# Patient Record
Sex: Male | Born: 1945 | Race: White | Hispanic: No | Marital: Married | Smoking: Former smoker
Health system: Southern US, Community
[De-identification: ages and names within clinical notes are randomized; demographics above are authoritative.]

## PROBLEM LIST (undated history)

## (undated) DIAGNOSIS — Z95810 Presence of automatic (implantable) cardiac defibrillator: Secondary | ICD-10-CM

## (undated) DIAGNOSIS — I495 Sick sinus syndrome: Secondary | ICD-10-CM

## (undated) DIAGNOSIS — I480 Paroxysmal atrial fibrillation: Secondary | ICD-10-CM

## (undated) DIAGNOSIS — I10 Essential (primary) hypertension: Secondary | ICD-10-CM

## (undated) DIAGNOSIS — C61 Malignant neoplasm of prostate: Secondary | ICD-10-CM

## (undated) DIAGNOSIS — J449 Chronic obstructive pulmonary disease, unspecified: Secondary | ICD-10-CM

## (undated) DIAGNOSIS — I639 Cerebral infarction, unspecified: Secondary | ICD-10-CM

## (undated) DIAGNOSIS — I2510 Atherosclerotic heart disease of native coronary artery without angina pectoris: Secondary | ICD-10-CM

## (undated) DIAGNOSIS — N189 Chronic kidney disease, unspecified: Secondary | ICD-10-CM

## (undated) DIAGNOSIS — E119 Type 2 diabetes mellitus without complications: Secondary | ICD-10-CM

## (undated) DIAGNOSIS — I5022 Chronic systolic (congestive) heart failure: Secondary | ICD-10-CM

## (undated) DIAGNOSIS — E785 Hyperlipidemia, unspecified: Secondary | ICD-10-CM

## (undated) DIAGNOSIS — D649 Anemia, unspecified: Secondary | ICD-10-CM

## (undated) DIAGNOSIS — I872 Venous insufficiency (chronic) (peripheral): Secondary | ICD-10-CM

## (undated) DIAGNOSIS — G4733 Obstructive sleep apnea (adult) (pediatric): Secondary | ICD-10-CM

## (undated) DIAGNOSIS — I739 Peripheral vascular disease, unspecified: Secondary | ICD-10-CM

## (undated) DIAGNOSIS — N200 Calculus of kidney: Secondary | ICD-10-CM

## (undated) HISTORY — DX: Chronic kidney disease, unspecified: N18.9

## (undated) HISTORY — PX: ROTATOR CUFF REPAIR: SHX139

## (undated) HISTORY — PX: CORONARY ARTERY BYPASS GRAFT: SHX141

## (undated) HISTORY — DX: Calculus of kidney: N20.0

## (undated) HISTORY — PX: FINGER SURGERY: SHX640

## (undated) HISTORY — PX: CARDIAC CATHETERIZATION: SHX172

## (undated) HISTORY — DX: Obstructive sleep apnea (adult) (pediatric): G47.33

## (undated) HISTORY — DX: Cerebral infarction, unspecified: I63.9

## (undated) HISTORY — PX: COLONOSCOPY: SHX174

## (undated) HISTORY — DX: Anemia, unspecified: D64.9

## (undated) HISTORY — PX: CHOLECYSTECTOMY: SHX55

## (undated) HISTORY — DX: Essential (primary) hypertension: I10

## (undated) HISTORY — DX: Hyperlipidemia, unspecified: E78.5

## (undated) HISTORY — PX: UMBILICAL HERNIA REPAIR: SUR1181

## (undated) HISTORY — PX: KNEE SURGERY: SHX244

## (undated) HISTORY — DX: Type 2 diabetes mellitus without complications: E11.9

## (undated) HISTORY — DX: Atherosclerotic heart disease of native coronary artery without angina pectoris: I25.10

---

## 1977-05-18 DIAGNOSIS — I82409 Acute embolism and thrombosis of unspecified deep veins of unspecified lower extremity: Secondary | ICD-10-CM

## 1977-05-18 HISTORY — DX: Acute embolism and thrombosis of unspecified deep veins of unspecified lower extremity: I82.409

## 2001-05-18 DIAGNOSIS — I2699 Other pulmonary embolism without acute cor pulmonale: Secondary | ICD-10-CM

## 2001-05-18 HISTORY — DX: Other pulmonary embolism without acute cor pulmonale: I26.99

## 2005-05-18 DIAGNOSIS — I219 Acute myocardial infarction, unspecified: Secondary | ICD-10-CM

## 2005-05-18 HISTORY — DX: Acute myocardial infarction, unspecified: I21.9

## 2015-05-19 HISTORY — PX: CORONARY ARTERY BYPASS GRAFT: SHX141

## 2015-08-09 HISTORY — PX: CORONARY ARTERY BYPASS GRAFT: SHX141

## 2020-09-10 ENCOUNTER — Ambulatory Visit (INDEPENDENT_AMBULATORY_CARE_PROVIDER_SITE_OTHER): Payer: Medicare Other | Admitting: Gastroenterology

## 2020-09-10 ENCOUNTER — Encounter: Payer: Self-pay | Admitting: Gastroenterology

## 2020-09-10 VITALS — BP 116/60 | HR 58 | Ht 68.0 in | Wt 196.4 lb

## 2020-09-10 DIAGNOSIS — R10A1 Flank pain, right side: Secondary | ICD-10-CM

## 2020-09-10 DIAGNOSIS — Z9229 Personal history of other drug therapy: Secondary | ICD-10-CM | POA: Diagnosis not present

## 2020-09-10 DIAGNOSIS — Z862 Personal history of diseases of the blood and blood-forming organs and certain disorders involving the immune mechanism: Secondary | ICD-10-CM | POA: Diagnosis not present

## 2020-09-10 DIAGNOSIS — R1900 Intra-abdominal and pelvic swelling, mass and lump, unspecified site: Secondary | ICD-10-CM

## 2020-09-10 DIAGNOSIS — K439 Ventral hernia without obstruction or gangrene: Secondary | ICD-10-CM | POA: Diagnosis not present

## 2020-09-10 DIAGNOSIS — K5909 Other constipation: Secondary | ICD-10-CM

## 2020-09-10 DIAGNOSIS — R109 Unspecified abdominal pain: Secondary | ICD-10-CM

## 2020-09-10 NOTE — Patient Instructions (Signed)
START: Fiber Con - Take 1-2 tablets daily as tolerated.   If you are age 75 or older, your body mass index should be between 23-30. Your Body mass index is 29.86 kg/m. If this is out of the aforementioned range listed, please consider follow up with your Primary Care Provider.  If you are age 13 or younger, your body mass index should be between 19-25. Your Body mass index is 29.86 kg/m. If this is out of the aformentioned range listed, please consider follow up with your Primary Care Provider.    Thank you for choosing me and [facility].  Dr. Rush Landmark

## 2020-09-10 NOTE — Progress Notes (Signed)
[place] VISIT   Primary Care Provider Barb Merino, MD [address] [place] [address] [phone]  Referring Provider Barb Merino, MD [address] [place] [address] [phone]  Patient Profile: Tom Bartlett is a 75 y.o. male with a pmh significant for  Stroke, peripheral arterial disease (status post previous angioplasties), CAD (status post CABG), diabetes, chronic renal insufficiency, hypertension, hyperlipidemia, skin cancers, OSA, previous hernia (ventral) repair.  The patient presents to the [facility] for an evaluation and management of problem(s) noted below:  Problem List 1. Abdominal discomfort in right flank   2. Ventral hernia without obstruction or gangrene   3. History of anticoagulant use   4. History of anemia   5. Chronic constipation     History of Present Illness This is the patient's first visit to the [facility].  He is accompanied by his wife.  The patient is referred by his neurology office for further evaluation of abdominal discomfort.  Unfortunately no records from a GI perspective are available as he is followed previously with the GI group in [place].  Patient describes since his last stroke in August, he has experienced a right flank and right periumbilical discomfort.  He feels like there is a knot or mass in that area.  He states he has had a work-up by many of his doctors including multiple imaging studies but his wife cannot corroborate this.  Patient has upper and lower endoscopies by the [facility].  Patient describes this more of a discomfort especially upon lateral rotation or movement.  Does not have pain that is prandial or postprandial in nature.  He is not having weight loss as a result of this.  His doctors had previously told him that this was a result of his previous stroke from August but he feels that as long as it has been lasting that it is  something different.  His wife recalls a previous ventral hernia repair years prior and the patient has mesh per her discussion.  Patient does not take significant nonsteroidals or BC/Goody powders.  Patient has a bowel movement on an every other day to every 2-day basis which is grossly unchanged for years.  He has no blood in his stools.  He has had a colonoscopy within the last 3 years per his report.  Patient's wife states that she just found out that there is an anemia that has occurred and he is going to be potentially referred to a hematologist but this information just came within the last few days and she is wondering if we have received those records (labs were done by different physician than the one who referred him to our group).  GI Review of Systems Positive as above Negative for dysphagia, odynophagia, nausea, vomiting, melena, hematochezia  Review of Systems General: Denies fevers/chills/weight loss unintentionally HEENT: Denies oral lesions Cardiovascular: Denies chest pain/palpitations Pulmonary: Shortness of breath is longstanding but having some progressive nature and he is being evaluated by cardiology with a stress test in the coming weeks Gastroenterological: See HPI Genitourinary: Denies darkened urine Hematological: Positive for history of easy bruising/bleeding due to anticoagulation Dermatological: Denies jaundice Psychological: Mood is stable   Medications Current Outpatient Medications  Medication Sig Dispense Refill  . acetaminophen (TYLENOL) 325 MG tablet Take 2 tablets by mouth in the morning and at bedtime.    . carvedilol (COREG) 25 MG tablet Take 25 mg by mouth 2 (two) times daily.    Marland Kitchen  hydrochlorothiazide (HYDRODIURIL) 25 MG tablet Take 1 tablet by mouth daily.    Marland Kitchen losartan (COZAAR) 100 MG tablet Take 1 tablet by mouth daily.    . metFORMIN (GLUCOPHAGE) 1000 MG tablet Take 1 tablet by mouth 2 (two) times daily.    . Multiple Vitamins-Minerals (EYE  HEALTH) CAPS Take 1 capsule by mouth in the morning and at bedtime.    . Niacin (VITAMIN B-3 PO) Take 2 tablets by mouth daily.    . Omega-3 Fatty Acids (FISH OIL) 1200 MG CAPS Take 1 capsule by mouth in the morning and at bedtime.    . torsemide (DEMADEX) 20 MG tablet Take 20 mg by mouth every other day.    . vitamin B-12 (CYANOCOBALAMIN) 100 MCG tablet Take 100 mcg by mouth daily.    Alveda Reasons 2.5 MG TABS tablet Take 2.5 mg by mouth 2 (two) times daily.     No current facility-administered medications for this visit.    Allergies Allergies  Allergen Reactions  . Statins Other (See Comments)    Histories Past Medical History:  Diagnosis Date  . CAD (coronary artery disease)   . Diabetes ([place])   . Dyslipidemia   . Hypertension   . Kidney stones   . Obstructive sleep apnea   . Stroke [facility])    Past Surgical History:  Procedure Laterality Date  . CHOLECYSTECTOMY    . COLONOSCOPY    . CORONARY ARTERY BYPASS GRAFT    . FINGER SURGERY    . KNEE SURGERY     R knee x 2, L knee x 1  . ROTATOR CUFF REPAIR Left   . UMBILICAL HERNIA REPAIR     Social History   Socioeconomic History  . Marital status: Married    Spouse name: Not on file  . Number of children: Not on file  . Years of education: Not on file  . Highest education level: Not on file  Occupational History  . Not on file  Tobacco Use  . Smoking status: Former Research scientist (life sciences)  . Smokeless tobacco: Never Used  Vaping Use  . Vaping Use: Never used  Substance and Sexual Activity  . Alcohol use: Not Currently  . Drug use: Never  . Sexual activity: Not on file  Other Topics Concern  . Not on file  Social History Narrative  . Not on file   Social Determinants of Health   Financial Resource Strain: Not on file  Food Insecurity: Not on file  Transportation Needs: Not on file  Physical Activity: Not on file  Stress: Not on file  Social Connections: Not on file  Intimate Partner Violence: Not on file   Family History   Problem Relation Age of Onset  . Dementia Mother   . Diabetes Father   . Heart disease Father   . Congestive Heart Failure Father   . Heart disease Sister   . Heart disease Brother   . Colon cancer Neg Hx   . Stomach cancer Neg Hx   . Pancreatic cancer Neg Hx   . Esophageal cancer Neg Hx   . Inflammatory bowel disease Neg Hx   . Liver disease Neg Hx   . Rectal cancer Neg Hx    I have reviewed his medical, social, and family history in detail and updated the electronic medical record as necessary.    PHYSICAL EXAMINATION  BP 116/60 (BP Location: Left Arm, Patient Position: Sitting, Cuff Size: Normal)   Pulse (!) 58   Ht 5\' 8"  (  1.727 m)   Wt 196 lb 6 oz (89.1 kg)   BMI 29.86 kg/m  Wt Readings from Last 3 Encounters:  09/10/20 196 lb 6 oz (89.1 kg)  GEN: NAD, elderly but appears stated age, doesn't appear chronically ill, accompanied by wife PSYCH: Cooperative, without pressured speech EYE: Conjunctivae pink, sclerae anicteric ENT: Masked CV: Nontachycardic RESP: No audible wheezing GI: NABS, soft, protuberant abdomen, distended, large ventral hernia present, potential partial hernia noted upon left and right lateral rotation, no palpable mass, without rebound or guarding, unable to appreciate hepatosplenomegaly due to body habitus MSK/EXT: Lower extremity edema present SKIN: No jaundice NEURO:  Alert & Oriented x 3, no focal deficits   REVIEW OF DATA  I reviewed the following data at the time of this encounter:  GI Procedures and Studies  Patient reports prior EGD/colonoscopy with [place] colon[place] within 3 years [place]  Laboratory Studies  No relevant studies to review  Imaging Studies  No relevant studies to review although patient notes that he has had multiple imaging studies that done [facility] including a possible CT   ASSESSMENT  Mr. Leonor LivHolt is a 75 y.o. male with a pmh significant for  Stroke, peripheral arterial disease (status post  previous angioplasties), CAD (status post CABG), diabetes, chronic renal insufficiency, hypertension, hyperlipidemia, skin cancers, OSA, previous hernia (ventral) repair.  The patient is seen today for evaluation and management of:  1. Abdominal discomfort in right flank   2. Ventral hernia without obstruction or gangrene   3. History of anticoagulant use   4. History of anemia   5. Chronic constipation    The patient is hemodynamically stable.  Clinically, the patient has persistent abdominal discomfort with concern for an unknown source of his right periumbilical discomfort.  Based on his clinical exam I suspect that this is intra-abdominal hernia related rather than a mass or lesion.  Cross-sectional imaging needs to be performed.  It is possible that it has been done previously and so we will try to obtain the last year worth of imaging that is available at [facility].  If the patient has not had a CT abdomen/pelvis then 1 will be recommended and ordered.  We will try to obtain the previous GI records for his prior EGD and colonoscopy to ensure we are up-to-date with the patient for his colon cancer screening.  We will try to obtain his PCP records for laboratories.  If the patient has evidence of iron deficiency anemia then upper and lower endoscopic evaluation will be recommended.  Patient has had some nosebleeds which could be a source for potential anemia but we will have to see how things look.  They defer on repeat blood work since it was just taking within the last couple of days.  Patient will be initiated on FiberCon and effort trying to optimize bowel habits further.  All patient questions were answered to the best of my ability, and the patient agrees to the aforementioned plan of action with follow-up as indicated.   PLAN  Obtain previous GI records to ensure patient is up-to-date for colon cancer screening Obtain PCP records and labs for last year Obtain imaging studies  from [facility] If patient has not had a CT abdomen/pelvis then this will need to be performed for further evaluation Consideration of referral to surgery was offered to the patient and wife but they deferred on this currently Initiate FiberCon once to twice daily to optimize bowel habits Patient will  follow up with cardiology to ensure that he would be stable for any endoscopic evaluation should it be required while pending a stress test   No orders of the defined types were placed in this encounter.   New Prescriptions   No medications on file   Modified Medications   No medications on file    Planned Follow Up No follow-ups on file.   Total Time in Face-to-Face and in Coordination of Care for patient including independent/personal interpretation/review of prior testing, medical history, examination, medication adjustment, communicating results with the patient directly, and documentation with the EHR is 45 minutes.   Justice Britain, MD [facility] Advanced Endoscopy Office # [phone]

## 2020-09-11 DIAGNOSIS — K439 Ventral hernia without obstruction or gangrene: Secondary | ICD-10-CM | POA: Insufficient documentation

## 2020-09-11 DIAGNOSIS — R10A1 Flank pain, right side: Secondary | ICD-10-CM | POA: Insufficient documentation

## 2020-09-11 DIAGNOSIS — R109 Unspecified abdominal pain: Secondary | ICD-10-CM | POA: Insufficient documentation

## 2020-09-11 DIAGNOSIS — K5909 Other constipation: Secondary | ICD-10-CM | POA: Insufficient documentation

## 2020-09-11 DIAGNOSIS — Z862 Personal history of diseases of the blood and blood-forming organs and certain disorders involving the immune mechanism: Secondary | ICD-10-CM | POA: Insufficient documentation

## 2020-09-11 DIAGNOSIS — Z9229 Personal history of other drug therapy: Secondary | ICD-10-CM | POA: Insufficient documentation

## 2020-09-25 ENCOUNTER — Encounter: Payer: Self-pay | Admitting: Gastroenterology

## 2020-09-25 NOTE — Progress Notes (Signed)
2015 colonoscopy Colon cancer screening Normal colonoscopy Screening colonoscopy recommended in 10 years   His colonoscopy report will be scanned into the chart Repeat colonoscopy due in 2025 and recall will be placed into the system.

## 2020-09-25 NOTE — Progress Notes (Signed)
Received a packet of images that are going to be sent to [facility] from [facility] imaging center  March 2022 lower extremity arterial Doppler studies Impression mild distal peripheral arterial disease  January 2022 CT abdomen without contrast Lung bases-heart is enlarged.  The lung bases are clear. Skeletal findings- the bony structures demonstrate degenerative changes.  Guidewires of the chest partially visualized. Liver-the liver is normal in size without gross evidence of masses or dilated intrahepatic ducts. Gallbladder- patient status post cholecystectomy. Pancreas-pancreas is normal in size without evidence of mass, surrounding inflammation, dilated pancreas duct. Adrenal glands- adrenal glands are unremarkable without suspicious nodules. Kidneys- kidneys are normal in size.  There is no hydronephrosis.  stones or suspicious inflammation.  Stable exophytic right renal lesion, likely complex cyst measuring 2 cm. Spleen-spleen is normal in density. Vascular- abdominal aorta and IVC are normal without abdominal aortic aneurysm. Retroperitoneum-no retroperitoneal adenopathy is identified. No ascites is identified. Bowel-no evidence of bowel edema or obstruction.  There is a large stool throughout the visualized colon. No suspicious soft tissue masses. However, there is eventration of the anterior upper abdomen containing fat just below the inferior sternum which may represent a relation to previous surgery and sternotomy.  September 2021 MRI brain Tiny focus of restricted diffusion involving the posterior lateral left thalamus suspicious for acute infarct. 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 No evidence for large vessel intracranial arterial stenosis or aneurysm.    Clinical suspicion remains for hernia and the CT abdomen suggest that there is a fat-containing eventration so suspect this is the issue for the patient.  We will still try to  obtain the prior EGD/colonoscopy reports as well as the PCP labs to see about the question of anemia.  Justice Britain, MD [facility] Advanced Endoscopy Office # [phone]

## 2020-09-25 NOTE — Progress Notes (Signed)
Thanks. I got the [facility] records and reviewed those. GM

## 2020-09-27 ENCOUNTER — Other Ambulatory Visit: Payer: Self-pay

## 2020-09-27 ENCOUNTER — Ambulatory Visit (HOSPITAL_COMMUNITY): Payer: Medicare Other | Admitting: Hematology and Oncology

## 2020-09-27 ENCOUNTER — Encounter: Payer: Self-pay | Admitting: Gastroenterology

## 2020-09-27 DIAGNOSIS — R109 Unspecified abdominal pain: Secondary | ICD-10-CM

## 2020-09-27 DIAGNOSIS — Z862 Personal history of diseases of the blood and blood-forming organs and certain disorders involving the immune mechanism: Secondary | ICD-10-CM

## 2020-09-27 DIAGNOSIS — R10A1 Flank pain, right side: Secondary | ICD-10-CM

## 2020-09-27 NOTE — Progress Notes (Signed)
Outside laboratories from [facility] from April 2022  Total cholesterol 150 HDL 21 LDL 75 Triglycerides 107 Sodium 140 Potassium 4.5 BUN/creatinine 37/1.52 AST/ALT 10/13 Alkaline phosphatase 55 Total bili 1.0 Albumin 4.2 Total protein 6.1 Hemoglobin/hematocrit 10.6/30.9 MCV 95.1 WBC 6.1 Platelet 133 Hemoglobin A1c 6.0   Recommend undergo laboratories as follows: CBC/iron/TIBC/ferritin/B12/folate/reticulocyte count/INR  Patient can have these labs done locally if necessary or through [facility].  Forward this information to my team to reach out to patient and discuss further.   Justice Britain, MD [facility] Advanced Endoscopy Office # [phone]

## 2020-09-27 NOTE — Progress Notes (Signed)
Left message on machine to call back  °Labs entered  °

## 2020-09-30 NOTE — Progress Notes (Signed)
The pt has been advised and will come in at his convenience for labs.  The orders have been entered.

## 2020-10-11 ENCOUNTER — Other Ambulatory Visit: Payer: Self-pay

## 2020-10-11 ENCOUNTER — Emergency Department (HOSPITAL_COMMUNITY): Payer: Medicare Other

## 2020-10-11 ENCOUNTER — Encounter (HOSPITAL_COMMUNITY): Payer: Self-pay

## 2020-10-11 ENCOUNTER — Emergency Department (HOSPITAL_COMMUNITY)
Admission: EM | Admit: 2020-10-11 | Discharge: 2020-10-11 | Disposition: A | Discharge status: Home / Self Care | Payer: Medicare Other | Attending: Emergency Medicine | Admitting: Emergency Medicine

## 2020-10-11 DIAGNOSIS — I129 Hypertensive chronic kidney disease with stage 1 through stage 4 chronic kidney disease, or unspecified chronic kidney disease: Secondary | ICD-10-CM | POA: Insufficient documentation

## 2020-10-11 DIAGNOSIS — Z87891 Personal history of nicotine dependence: Secondary | ICD-10-CM | POA: Diagnosis not present

## 2020-10-11 DIAGNOSIS — N189 Chronic kidney disease, unspecified: Secondary | ICD-10-CM | POA: Diagnosis not present

## 2020-10-11 DIAGNOSIS — Z7984 Long term (current) use of oral hypoglycemic drugs: Secondary | ICD-10-CM | POA: Insufficient documentation

## 2020-10-11 DIAGNOSIS — I2510 Atherosclerotic heart disease of native coronary artery without angina pectoris: Secondary | ICD-10-CM | POA: Insufficient documentation

## 2020-10-11 DIAGNOSIS — Z7901 Long term (current) use of anticoagulants: Secondary | ICD-10-CM | POA: Insufficient documentation

## 2020-10-11 DIAGNOSIS — E1122 Type 2 diabetes mellitus with diabetic chronic kidney disease: Secondary | ICD-10-CM | POA: Insufficient documentation

## 2020-10-11 DIAGNOSIS — U071 COVID-19: Secondary | ICD-10-CM | POA: Insufficient documentation

## 2020-10-11 DIAGNOSIS — Z79899 Other long term (current) drug therapy: Secondary | ICD-10-CM | POA: Insufficient documentation

## 2020-10-11 DIAGNOSIS — Z951 Presence of aortocoronary bypass graft: Secondary | ICD-10-CM | POA: Diagnosis not present

## 2020-10-11 DIAGNOSIS — R059 Cough, unspecified: Secondary | ICD-10-CM | POA: Diagnosis present

## 2020-10-11 LAB — CBC WITH DIFFERENTIAL/PLATELET
Abs Immature Granulocytes: 0.03 10*3/uL (ref 0.00–0.07)
Basophils Absolute: 0 10*3/uL (ref 0.0–0.1)
Basophils Relative: 0 %
Eosinophils Absolute: 0.1 10*3/uL (ref 0.0–0.5)
Eosinophils Relative: 1 %
HCT: 34 % — ABNORMAL LOW (ref 39.0–52.0)
Hemoglobin: 11.1 g/dL — ABNORMAL LOW (ref 13.0–17.0)
Immature Granulocytes: 1 %
Lymphocytes Relative: 18 %
Lymphs Abs: 0.8 10*3/uL (ref 0.7–4.0)
MCH: 31.6 pg (ref 26.0–34.0)
MCHC: 32.6 g/dL (ref 30.0–36.0)
MCV: 96.9 fL (ref 80.0–100.0)
Monocytes Absolute: 0.8 10*3/uL (ref 0.1–1.0)
Monocytes Relative: 16 %
Neutro Abs: 3.1 10*3/uL (ref 1.7–7.7)
Neutrophils Relative %: 64 %
Platelets: 44 10*3/uL — ABNORMAL LOW (ref 150–400)
RBC: 3.51 MIL/uL — ABNORMAL LOW (ref 4.22–5.81)
RDW: 15.9 % — ABNORMAL HIGH (ref 11.5–15.5)
WBC: 4.8 10*3/uL (ref 4.0–10.5)
nRBC: 0 % (ref 0.0–0.2)

## 2020-10-11 LAB — COMPREHENSIVE METABOLIC PANEL
ALT: 14 U/L (ref 0–44)
AST: 16 U/L (ref 15–41)
Albumin: 3.8 g/dL (ref 3.5–5.0)
Alkaline Phosphatase: 72 U/L (ref 38–126)
Anion gap: 6 (ref 5–15)
BUN: 28 mg/dL — ABNORMAL HIGH (ref 8–23)
CO2: 26 mmol/L (ref 22–32)
Calcium: 9 mg/dL (ref 8.9–10.3)
Chloride: 106 mmol/L (ref 98–111)
Creatinine, Ser: 1.79 mg/dL — ABNORMAL HIGH (ref 0.61–1.24)
GFR, Estimated: 39 mL/min — ABNORMAL LOW (ref >60)
Glucose, Bld: 128 mg/dL — ABNORMAL HIGH (ref 70–99)
Potassium: 4.3 mmol/L (ref 3.5–5.1)
Sodium: 138 mmol/L (ref 135–145)
Total Bilirubin: 1.7 mg/dL — ABNORMAL HIGH (ref 0.3–1.2)
Total Protein: 6.5 g/dL (ref 6.5–8.1)

## 2020-10-11 MED ORDER — MOLNUPIRAVIR EUA 200MG CAPSULE: 4.0000 | ORAL_CAPSULE | Freq: Two times a day (BID) | ORAL | 0 refills | Status: AC

## 2020-10-11 NOTE — ED Provider Notes (Addendum)
[facility] EMERGENCY DEPARTMENT Provider Note   CSN: [id number] Arrival date & time: 10/11/20  1347     History Chief Complaint  Patient presents with  . Cough    Tom Bartlett is a 75 y.o. male.  Presents to ER with concern for COVID.  Patient reports 2 days ago he started having some symptoms.  Has had increasing cough.  Some nasal congestion.  Notably he has not had any difficulty of breathing, no chest pains or abdominal pains, no nausea or vomiting.  States his diet is fine.  Per care everywhere, has history of CKD and last creatinine was 1.61. diabetes mellitus, hypertension, coronary artery disease s/p CABG back in 2017 history of kidney stone disease and chronic kidney disease stage   Reports recently underwent recent cardiac catheterization 2 weeks ago.  Vaccinated x 3 for covid.    HPI     Past Medical History:  Diagnosis Date  . CAD (coronary artery disease)   . Diabetes ([place])   . Dyslipidemia   . Hypertension   . Kidney stones   . Obstructive sleep apnea   . Stroke [facility])     Patient Active Problem List   Diagnosis Date Noted  . Chronic constipation 09/11/2020  . History of anemia 09/11/2020  . History of anticoagulant use 09/11/2020  . Ventral hernia without obstruction or gangrene 09/11/2020  . Abdominal discomfort in right flank 09/11/2020    Past Surgical History:  Procedure Laterality Date  . CHOLECYSTECTOMY    . COLONOSCOPY    . CORONARY ARTERY BYPASS GRAFT    . FINGER SURGERY    . KNEE SURGERY     R knee x 2, L knee x 1  . ROTATOR CUFF REPAIR Left   . UMBILICAL HERNIA REPAIR         Family History  Problem Relation Age of Onset  . Dementia Mother   . Diabetes Father   . Heart disease Father   . Congestive Heart Failure Father   . Heart disease Sister   . Heart disease Brother   . Colon cancer Neg Hx   . Stomach cancer Neg Hx   . Pancreatic cancer Neg Hx   . Esophageal cancer Neg Hx   . Inflammatory bowel  disease Neg Hx   . Liver disease Neg Hx   . Rectal cancer Neg Hx     Social History   Tobacco Use  . Smoking status: Former Research scientist (life sciences)  . Smokeless tobacco: Never Used  Vaping Use  . Vaping Use: Never used  Substance Use Topics  . Alcohol use: Not Currently  . Drug use: Never    Home Medications Prior to Admission medications   Medication Sig Start Date End Date Taking? Authorizing Provider  molnupiravir EUA 200 mg CAPS Take 4 capsules (800 mg total) by mouth 2 (two) times daily for 5 days. 10/11/20 10/16/20 Yes Ikenna Ohms, Ellwood Dense, MD  acetaminophen (TYLENOL) 325 MG tablet Take 2 tablets by mouth in the morning and at bedtime.    [provider]  carvedilol (COREG) 25 MG tablet Take 25 mg by mouth 2 (two) times daily. 06/27/20   [provider]  hydrochlorothiazide (HYDRODIURIL) 25 MG tablet Take 1 tablet by mouth daily. 06/26/20   [provider]  losartan (COZAAR) 100 MG tablet Take 1 tablet by mouth daily. 06/29/20   [provider]  metFORMIN (GLUCOPHAGE) 1000 MG tablet Take 1 tablet by mouth 2 (two) times daily. 06/16/20  [provider]  Multiple Vitamins-Minerals (EYE HEALTH) CAPS Take 1 capsule by mouth in the morning and at bedtime.    [provider]  Niacin (VITAMIN B-3 PO) Take 2 tablets by mouth daily.    [provider]  Omega-3 Fatty Acids (FISH OIL) 1200 MG CAPS Take 1 capsule by mouth in the morning and at bedtime.    [provider]  torsemide (DEMADEX) 20 MG tablet Take 20 mg by mouth every other day. 06/04/20   [provider]  vitamin B-12 (CYANOCOBALAMIN) 100 MCG tablet Take 100 mcg by mouth daily.    [provider]  XARELTO 2.5 MG TABS tablet Take 2.5 mg by mouth 2 (two) times daily. 08/13/20   [provider]    Allergies    Statins  Review of Systems   Review of Systems  Constitutional: Negative for chills and fever.  HENT: Positive for congestion. Negative for ear  pain and sore throat.   Eyes: Negative for pain and visual disturbance.  Respiratory: Positive for cough. Negative for shortness of breath.   Cardiovascular: Negative for chest pain and palpitations.  Gastrointestinal: Negative for abdominal pain and vomiting.  Genitourinary: Negative for dysuria and hematuria.  Musculoskeletal: Negative for arthralgias and back pain.  Skin: Negative for color change and rash.  Neurological: Negative for seizures and syncope.  All other systems reviewed and are negative.   Physical Exam Updated Vital Signs BP (!) 153/93   Pulse 76   Temp 97.9 F (36.6 C) (Oral)   Resp 18   SpO2 98%   Physical Exam Vitals and nursing note reviewed.  Constitutional:      Appearance: He is well-developed.  HENT:     Head: Normocephalic and atraumatic.  Eyes:     Conjunctiva/sclera: Conjunctivae normal.  Cardiovascular:     Rate and Rhythm: Normal rate and regular rhythm.     Heart sounds: No murmur heard.   Pulmonary:     Effort: Pulmonary effort is normal. No respiratory distress.     Breath sounds: Normal breath sounds.  Abdominal:     Palpations: Abdomen is soft.     Tenderness: There is no abdominal tenderness.  Musculoskeletal:        General: No deformity or signs of injury.     Cervical back: Neck supple.  Skin:    General: Skin is warm and dry.  Neurological:     General: No focal deficit present.     Mental Status: He is alert.  Psychiatric:        Mood and Affect: Mood normal.     ED Results / Procedures / Treatments   Labs (all labs ordered are listed, but only abnormal results are displayed) Labs Reviewed  CBC WITH DIFFERENTIAL/PLATELET - Abnormal; Notable for the following components:      Result Value   RBC 3.51 (*)    Hemoglobin 11.1 (*)    HCT 34.0 (*)    RDW 15.9 (*)    Platelets 44 (*)    All other components within normal limits  COMPREHENSIVE METABOLIC PANEL - Abnormal; Notable for the following components:   Glucose,  Bld 128 (*)    BUN 28 (*)    Creatinine, Ser 1.79 (*)    Total Bilirubin 1.7 (*)    GFR, Estimated 39 (*)    All other components within normal limits    EKG EKG Interpretation  Date/Time:  Friday Oct 11 2020 14:07:16 EDT Ventricular Rate:  76  PR Interval:    QRS Duration: 102 QT Interval:  414 QTC Calculation: 465 R Axis:   23 Text Interpretation: Undetermined rhythm Left ventricular hypertrophy with repolarization abnormality ( Cornell product ) Abnormal ECG Confirmed by Madalyn Rob [id number]) on 10/11/2020 10:23:38 PM   Radiology DG Chest 2 View  Result Date: 10/11/2020 CLINICAL DATA:  COVID and cough. EXAM: CHEST - 2 VIEW COMPARISON:  02/24/2012 FINDINGS: Heart size is slightly enlarged with post CABG changes. Lungs are clear without focal airspace disease or pulmonary edema. No pleural effusions. Degenerative changes in the thoracic spine. IMPRESSION: Mild cardiomegaly without focal lung disease. Electronically Signed   By: Markus Daft M.D.   On: 10/11/2020 15:26    Procedures Procedures   Medications Ordered in ED Medications - No data to display  ED Course  I have reviewed the triage vital signs and the nursing notes.  Pertinent labs & imaging results that were available during my care of the patient were reviewed by me and considered in my medical decision making (see chart for details).  Clinical Course as of 10/11/20 2252  Fri Oct 11, 2020  2241 D/w Vira Browns D - no paxlovid due to drug interactions - rec  Molnupiravir [RD]    Clinical Course User Index [RD] Lucrezia Starch, MD   MDM Rules/Calculators/A&P                         75 year old gentleman presents to ER with concern for cough and congestion.  Reports testing positive for COVID-19 at urgent care.  Reviewed urgent care notes that patient had in room printed and patient did in fact test positive earlier today.  His oxygen saturations are normal, his lungs are clear, no tachypnea.  Chest x-ray  normal.  His basic labs are stable.  Noted creatinine of 1.7, has history of CKD noted in care everywhere and last creatinine was 1.6.  He does report recent heart catheterization.  Notably he denies any chest pain or difficulty in breathing.  Given his overall well appearance and normal vital signs and reassuring labs, believe he is appropriate for discharge and outpatient management.  Discussed with pharmacy, will prescribe Molnupiravir.  rec recheck with PCP.    After the discussed management above, the patient was determined to be safe for discharge.  The patient was in agreement with this plan and all questions regarding their care were answered.  ED return precautions were discussed and the patient will return to the ED with any significant worsening of condition.    Final Clinical Impression(s) / ED Diagnoses Final diagnoses:  COVID-19    Rx / DC Orders ED Discharge Orders         Ordered    molnupiravir EUA 200 mg CAPS  2 times daily        10/11/20 2240    Ambulatory referral for Covid Treatment        10/11/20 2242           Lucrezia Starch, MD 10/11/20 2252    Lucrezia Starch, MD 10/11/20 2252

## 2020-10-11 NOTE — ED Triage Notes (Signed)
Pt complains of positive for COVID-19 3 hours prior to arrival.  [facility]  Sent here to emergency department for further evaluation.  Patient denies chest pain, increasing shortness of breath, pain.  At heart cath 2 weeks ago.  He denies any lower extremity edema, hemoptysis

## 2020-10-11 NOTE — Discharge Instructions (Signed)
Recommend starting the medication as prescribed.  Please schedule a follow-up appoint with your primary doctor for recheck.  If you develop any chest pain, any difficulty in breathing you should go to the nearest emergency room for reassessment.

## 2020-10-11 NOTE — ED Notes (Signed)
Called for vitals no answer °

## 2020-10-11 NOTE — ED Provider Notes (Signed)
Emergency Medicine Provider Triage Evaluation Note  Tom Bartlett , a 75 y.o. male  was evaluated in triage.  Pt complains of positive for COVID-19 3 hours prior to arrival.  [facility]  Sent here to emergency department for further evaluation.  Patient denies chest pain, increasing shortness of breath, pain.  At heart cath 2 weeks ago.  He denies any lower extremity edema, hemoptysis  Review of Systems  Positive: Cough, rhinorrhea Negative: Fever, emesis, chest pain, shortness of breath, abdominal pain  Physical Exam  There were no vitals taken for this visit. Gen:   Awake, no distress   Resp:  Normal effort  MSK:   Moves extremities without difficulty  Other:    Medical Decision Making  Medically screening exam initiated at 2:00 PM.  Appropriate orders placed.  Tom Bartlett was informed that the remainder of the evaluation will be completed by another provider, this initial triage assessment does not replace that evaluation, and the importance of remaining in the ED until their evaluation is complete.  Known COVID-positive, cough, congestion   [place] A, PA-C 10/11/20 Hiko, Ankit, MD 10/11/20 1710

## 2021-03-04 ENCOUNTER — Other Ambulatory Visit (INDEPENDENT_AMBULATORY_CARE_PROVIDER_SITE_OTHER): Payer: Medicare Other

## 2021-03-04 DIAGNOSIS — R109 Unspecified abdominal pain: Secondary | ICD-10-CM | POA: Diagnosis not present

## 2021-03-04 DIAGNOSIS — Z862 Personal history of diseases of the blood and blood-forming organs and certain disorders involving the immune mechanism: Secondary | ICD-10-CM | POA: Diagnosis not present

## 2021-03-04 LAB — CBC WITH DIFFERENTIAL/PLATELET
Basophils Absolute: 0 10*3/uL (ref 0.0–0.1)
Basophils Relative: 0.6 % (ref 0.0–3.0)
Eosinophils Absolute: 0.3 10*3/uL (ref 0.0–0.7)
Eosinophils Relative: 4.2 % (ref 0.0–5.0)
HCT: 41 % (ref 39.0–52.0)
Hemoglobin: 13.6 g/dL (ref 13.0–17.0)
Lymphocytes Relative: 21.8 % (ref 12.0–46.0)
Lymphs Abs: 1.5 10*3/uL (ref 0.7–4.0)
MCHC: 33.1 g/dL (ref 30.0–36.0)
MCV: 95.2 fl (ref 78.0–100.0)
Monocytes Absolute: 0.7 10*3/uL (ref 0.1–1.0)
Monocytes Relative: 9.3 % (ref 3.0–12.0)
Neutro Abs: 4.5 10*3/uL (ref 1.4–7.7)
Neutrophils Relative %: 64.1 % (ref 43.0–77.0)
Platelets: 135 10*3/uL — ABNORMAL LOW (ref 150.0–400.0)
RBC: 4.3 Mil/uL (ref 4.22–5.81)
RDW: 15.6 % — ABNORMAL HIGH (ref 11.5–15.5)
WBC: 7 10*3/uL (ref 4.0–10.5)

## 2021-03-04 LAB — IBC + FERRITIN
Ferritin: 72.7 ng/mL (ref 22.0–322.0)
Iron: 101 ug/dL (ref 42–165)
Saturation Ratios: 33.2 % (ref 20.0–50.0)
TIBC: 303.8 ug/dL (ref 250.0–450.0)
Transferrin: 217 mg/dL (ref 212.0–360.0)

## 2021-03-04 LAB — PROTIME-INR
INR: 1.2 ratio — ABNORMAL HIGH (ref 0.8–1.0)
Prothrombin Time: 13.3 s — ABNORMAL HIGH (ref 9.6–13.1)

## 2021-03-04 LAB — RETICULOCYTES
ABS Retic: 104160 cells/uL — ABNORMAL HIGH (ref 25000–90000)
Retic Ct Pct: 2.4 %

## 2021-03-04 LAB — FOLATE: Folate: >23.4 ng/mL (ref >5.9)

## 2021-03-04 LAB — VITAMIN B12: Vitamin B-12: 1437 pg/mL — ABNORMAL HIGH (ref 211–911)

## 2021-05-20 ENCOUNTER — Other Ambulatory Visit (INDEPENDENT_AMBULATORY_CARE_PROVIDER_SITE_OTHER): Payer: Medicare Other

## 2021-05-20 ENCOUNTER — Ambulatory Visit (INDEPENDENT_AMBULATORY_CARE_PROVIDER_SITE_OTHER)
Admission: RE | Admit: 2021-05-20 | Discharge: 2021-05-20 | Disposition: A | Discharge status: Home / Self Care | Payer: Medicare Other | Source: Ambulatory Visit | Attending: Gastroenterology | Admitting: Gastroenterology

## 2021-05-20 ENCOUNTER — Other Ambulatory Visit: Payer: Self-pay

## 2021-05-20 ENCOUNTER — Encounter: Payer: Self-pay | Admitting: Gastroenterology

## 2021-05-20 ENCOUNTER — Ambulatory Visit (INDEPENDENT_AMBULATORY_CARE_PROVIDER_SITE_OTHER): Payer: Medicare Other | Admitting: Gastroenterology

## 2021-05-20 VITALS — BP 126/66 | HR 71 | Ht 68.0 in | Wt 199.0 lb

## 2021-05-20 DIAGNOSIS — Z862 Personal history of diseases of the blood and blood-forming organs and certain disorders involving the immune mechanism: Secondary | ICD-10-CM

## 2021-05-20 DIAGNOSIS — K5909 Other constipation: Secondary | ICD-10-CM | POA: Diagnosis not present

## 2021-05-20 DIAGNOSIS — K439 Ventral hernia without obstruction or gangrene: Secondary | ICD-10-CM | POA: Diagnosis not present

## 2021-05-20 DIAGNOSIS — R1084 Generalized abdominal pain: Secondary | ICD-10-CM

## 2021-05-20 LAB — COMPREHENSIVE METABOLIC PANEL
ALT: 9 U/L (ref 0–53)
AST: 9 U/L (ref 0–37)
Albumin: 4 g/dL (ref 3.5–5.2)
Alkaline Phosphatase: 80 U/L (ref 39–117)
BUN: 31 mg/dL — ABNORMAL HIGH (ref 6–23)
CO2: 22 mEq/L (ref 19–32)
Calcium: 9.1 mg/dL (ref 8.4–10.5)
Chloride: 104 mEq/L (ref 96–112)
Creatinine, Ser: 1.59 mg/dL — ABNORMAL HIGH (ref 0.40–1.50)
GFR: 41.97 mL/min — ABNORMAL LOW (ref >60.00)
Glucose, Bld: 250 mg/dL — ABNORMAL HIGH (ref 70–99)
Potassium: 4.3 mEq/L (ref 3.5–5.1)
Sodium: 136 mEq/L (ref 135–145)
Total Bilirubin: 0.9 mg/dL (ref 0.2–1.2)
Total Protein: 6.8 g/dL (ref 6.0–8.3)

## 2021-05-20 LAB — CBC
HCT: 36.6 % — ABNORMAL LOW (ref 39.0–52.0)
Hemoglobin: 12.5 g/dL — ABNORMAL LOW (ref 13.0–17.0)
MCHC: 34.2 g/dL (ref 30.0–36.0)
MCV: 92.4 fl (ref 78.0–100.0)
Platelets: 143 10*3/uL — ABNORMAL LOW (ref 150.0–400.0)
RBC: 3.97 Mil/uL — ABNORMAL LOW (ref 4.22–5.81)
RDW: 15.3 % (ref 11.5–15.5)
WBC: 9.2 10*3/uL (ref 4.0–10.5)

## 2021-05-20 LAB — IBC + FERRITIN
Ferritin: 131.1 ng/mL (ref 22.0–322.0)
Iron: 44 ug/dL (ref 42–165)
Saturation Ratios: 16 % — ABNORMAL LOW (ref 20.0–50.0)
TIBC: 275.8 ug/dL (ref 250.0–450.0)
Transferrin: 197 mg/dL — ABNORMAL LOW (ref 212.0–360.0)

## 2021-05-20 NOTE — Progress Notes (Signed)
[facility] VISIT   Primary Care Provider Coolidge Breeze, FNP [address] [place] [id number] [phone]  Patient Profile: Tom Bartlett is a 76 y.o. male with a pmh significant for  Stroke, peripheral arterial disease (status post previous angioplasties), CAD (status post CABG), diabetes, chronic renal insufficiency, hypertension, hyperlipidemia, skin cancers, OSA, status postcholecystectomy, previous hernia (ventral) repair.  The patient presents to the [facility] for an evaluation and management of problem(s) noted below:  Problem List 1. Ventral hernia without obstruction or gangrene   2. Generalized abdominal pain   3. History of anemia   4. Chronic constipation      History of Present Illness Please see prior notes for full details of HPI.  Interval History The patient returns with his wife for a scheduled visit.  The patient has not been seen in the office since April of last year.  Patient states that overall things had stabilized somewhat and although he was still having pain that was coming and going it was not to the significant degree of what he is experiencing now.  He is having a burning discomfort that is occurring throughout his abdomen mostly in the upper area and middle portion.  This extends to his flanks bilaterally.  It continues to feel mass/knot-like.  Patient's bowels are moving a little bit more appropriately although he got more straining as he was on iron supplementation.  When he is not taking iron he does not have straining as significantly.  He is taking fiber supplementation which has helped somewhat.  Last laboratories in October were reviewed and these showed no evidence of anemia or iron deficiency at that time.  Patient feels like his discomfort is been worsened over the last 2-1/2 to 3 weeks and it is worse than it has ever been.  Lateral rotation and movements worsens things.  He reports a cardiac  catheterization last year which showed an EF of less than 30%.  GI Review of Systems Positive as above Negative for odynophagia, dysphagia, nausea, vomiting, melena, hematochezia   Review of Systems General: Denies fevers/chills/weight loss unintentionally Cardiovascular: Denies chest pain Pulmonary: SOB stable though with worsening pain it has worsened as well Gastroenterological: See HPI Genitourinary: Denies darkened urine Hematological: Positive for history of easy bruising/bleeding due to anticoagulation Dermatological: Denies jaundice Psychological: Mood is stable   Medications Current Outpatient Medications  Medication Sig Dispense Refill   acetaminophen (TYLENOL) 325 MG tablet Take 2 tablets by mouth as needed.     Calcium Polycarbophil (FIBER) 625 MG TABS Take by mouth.     carvedilol (COREG) 25 MG tablet Take 12.5 mg by mouth 2 (two) times daily.     empagliflozin (JARDIANCE) 10 MG TABS tablet 1 tablet     Iron-FA-B Cmp-C-Biot-Probiotic (FUSION PLUS) CAPS Take 1 capsule by mouth daily.     Multiple Vitamins-Minerals (EYE HEALTH) CAPS Take 1 capsule by mouth in the morning and at bedtime.     Niacin (VITAMIN B-3 PO) Take 2 tablets by mouth daily.     Omega-3 Fatty Acids (FISH OIL) 1200 MG CAPS Take 1 capsule by mouth in the morning and at bedtime.     sacubitril-valsartan (ENTRESTO) 49-51 MG 2 (two) times daily.     torsemide (DEMADEX) 20 MG tablet Take 20 mg by mouth every other day.     vitamin B-12 (CYANOCOBALAMIN) 100 MCG tablet Take 100 mcg by mouth daily.     XARELTO 2.5 MG TABS tablet  Take 2.5 mg by mouth 2 (two) times daily.     No current facility-administered medications for this visit.    Allergies Allergies  Allergen Reactions   Statins Other (See Comments)    Histories Past Medical History:  Diagnosis Date   Anemia    CAD (coronary artery disease)    CKD (chronic kidney disease)    Diabetes ([place])    Dyslipidemia    Hypertension    Kidney stones     Obstructive sleep apnea    Stroke [facility])    Past Surgical History:  Procedure Laterality Date   CHOLECYSTECTOMY     COLONOSCOPY     CORONARY ARTERY BYPASS GRAFT  2017   FINGER SURGERY     KNEE SURGERY     R knee x 2, L knee x 1   ROTATOR CUFF REPAIR Left    UMBILICAL HERNIA REPAIR     Social History   Socioeconomic History   Marital status: Married    Spouse name: Not on file   Number of children: Not on file   Years of education: Not on file   Highest education level: Not on file  Occupational History   Not on file  Tobacco Use   Smoking status: Former   Smokeless tobacco: Never  Vaping Use   Vaping Use: Never used  Substance and Sexual Activity   Alcohol use: Not Currently   Drug use: Never   Sexual activity: Not on file  Other Topics Concern   Not on file  Social History Narrative   Not on file   Social Determinants of Health   Financial Resource Strain: Not on file  Food Insecurity: Not on file  Transportation Needs: Not on file  Physical Activity: Not on file  Stress: Not on file  Social Connections: Not on file  Intimate Partner Violence: Not on file   Family History  Problem Relation Age of Onset   Dementia Mother    Diabetes Father    Heart disease Father    Congestive Heart Failure Father    Heart disease Sister    Heart disease Brother    Colon cancer Neg Hx    Stomach cancer Neg Hx    Pancreatic cancer Neg Hx    Esophageal cancer Neg Hx    Inflammatory bowel disease Neg Hx    Liver disease Neg Hx    Rectal cancer Neg Hx    I have reviewed his medical, social, and family history in detail and updated the electronic medical record as necessary.    PHYSICAL EXAMINATION  BP 126/66    Pulse 71    Ht '5\' 8"'  (1.727 m)    Wt 199 lb (90.3 kg)    SpO2 95%    BMI 30.26 kg/m  Wt Readings from Last 3 Encounters:  05/20/21 199 lb (90.3 kg)  09/10/20 196 lb 6 oz (89.1 kg)  GEN: Cannot sit due to discomfort while sitting but improved while he is  standing, elderly, doesn't appear chronically ill, accompanied by wife PSYCH: Cooperative, without pressured speech EYE: Conjunctivae pink, sclerae anicteric ENT: MMM CV: Nontachycardic RESP: No audible wheezing GI: NABS, soft, significantly protuberant abdomen, distended, large ventral hernia present, potential partial hernias noted laterally noted upon left and right rotation, no palpable mass, without rebound or guarding, unable to appreciate hepatosplenomegaly due to body habitus MSK/EXT: Lower extremity edema present SKIN: No jaundice NEURO:  Alert & Oriented x 3, no focal deficits   REVIEW OF DATA  I reviewed the following data at the time of this encounter:  GI Procedures and Studies  [place] in [place] with follow-up due in 2025 for normal screening issues  Laboratory Studies  No relevant studies to review  Imaging Studies  Previously documented in 09/25/2020 documentation note   ASSESSMENT  Mr. Sitar is a 76 y.o. male with a pmh significant for  Stroke, peripheral arterial disease (status post previous angioplasties), CAD (status post CABG), diabetes, chronic renal insufficiency, hypertension, hyperlipidemia, skin cancers, OSA, status post cholecystectomy, previous hernia (ventral) repair.  The patient is seen today for evaluation and management of:  1. Ventral hernia without obstruction or gangrene   2. Generalized abdominal pain   3. History of anemia   4. Chronic constipation    The patient is hemodynamically stable on evaluation today.  The patient's abdominal discomfort is a result of his significant abdominal hernias.  Cross-sectional imaging has previously shown this and his physical exam is significant for this as well.  It is not clear to me that his mesh is helpful.  I have more concern at this time seeing that he is having much more issues over the course the last 3 weeks so we will need to repeat cross-sectional imaging at this time.  I will  get a KUB to ensure there is no evidence of a significant abdominal catastrophe though he does not have any overt rebound.  He will continue his current therapy from a constipation standpoint and we went over toileting techniques as well.  It is not clear to me that an upper or lower endoscopy will further define the patient's symptoms and I think surgical evaluation is most reasonable.  However, with the patient's significant heart failure and significantly depressed EF, I am not sure that he is an ideal candidate for surgery either but it is helpful for [place] to try and help get things moving and define neck steps in his work-up.  I will send a prescription for an abdominal binder that we will try to have him use in the interim.Hanley Seamen questions   PLAN  KUB today CT abdomen/pelvis with oral contrast due to the patient's CRI Laboratories to be obtained today If patient does not have evidence of significant anemia or iron deficiency, then we will plan to allow his iron to go to every other day dosing If patient has evidence of iron deficiency with anemia, will need to consider EGD/colonoscopy Referral to general surgery for consideration of surgical management of his significant hernia disease - Understand he is a high risk individual due to his underlying heart disease If any endoscopic evaluations will need to be performed, will need to be done in the hospital-based setting due to his underlying heart failure and would also need to get anticoagulation hold approval Toileting techniques discussed FiberCon to continue   Orders Placed This Encounter  Procedures   DG Abd 2 Views   CT Abdomen Pelvis W Contrast   CBC   Comp Met (CMET)   IBC + Ferritin   Ambulatory referral to General Surgery    New Prescriptions   No medications on file   Modified Medications   No medications on file    Planned Follow Up No follow-ups on file.   Total Time in Face-to-Face and in Coordination of Care for  patient including independent/personal interpretation/review of prior testing, medical history, examination, medication adjustment, communicating results with the patient directly, and documentation with the EHR is 25 minutes.  Justice Britain, MD [facility]oscopy Office # [phone]

## 2021-05-20 NOTE — Patient Instructions (Signed)
Your provider has requested that you have an abdominal x ray before leaving today. Please go to the basement floor to our Radiology department for the test.  Your provider has requested that you go to the basement level for lab work before leaving today. Press "B" on the elevator. The lab is located at the first door on the left as you exit the elevator.  You have been scheduled for a CT scan of the abdomen and pelvis at [facility], 1st floor Radiology. You are scheduled on 06/03/21  at 10:00am. You should arrive 15 minutes prior to your appointment time for registration. The solution may taste better if refrigerated, but do NOT add ice or any other liquid to this solution. Shake well before drinking.   Please follow the written instructions below on the day of your exam:   1) Do not eat anything after 6:00am (4 hours prior to your test)   2) Drink 1 bottle of contrast @ 8:00am (2 hours prior to your exam)  Remember to shake well before drinking and do NOT pour over ice.     Drink 1 bottle of contrast @ 9:00am (1 hour prior to your exam)   You may take any medications as prescribed with a small amount of water, if necessary. If you take any of the following medications: METFORMIN, GLUCOPHAGE, GLUCOVANCE, AVANDAMET, RIOMET, FORTAMET, [place] MET, JANUMET, GLUMETZA or METAGLIP, you MAY be asked to HOLD this medication 48 hours AFTER the exam.   The purpose of you drinking the oral contrast is to aid in the visualization of your intestinal tract. The contrast solution may cause some diarrhea. Depending on your individual set of symptoms, you may also receive an intravenous injection of x-ray contrast/dye. Plan on being at [facility] for 45 minutes or longer, depending on the type of exam you are having performed.   If you have any questions regarding your exam or if you need to reschedule, you may call [facility] at [phone] between the hours of 8:00 am and 5:00 pm,  Monday-Friday.   You have been referred to [facility].  Make certain to bring a list of current medications, including any over the counter medications or vitamins. Also bring your co-pay if you have one as well as your insurance cards. [facility] is located at [address], [address]. If you have not heard their office in 1-2 weeks, please contact them at [phone].  Toileting tips to help with your constipation - Drink at least 64-80 ounces of water/liquid per day. - Establish a time to try to move your bowels every day.  For many people, this is after a cup of coffee or after a meal such as breakfast. - Sit all of the way back on the toilet keeping your back fairly straight and while sitting up, try to rest the tops of your forearms on your upper thighs.   - Raising your feet with a step stool/squatty potty can be helpful to improve the angle that allows your stool to pass through the rectum. - Relax the rectum feeling it bulge toward the toilet water.  If you feel your rectum raising toward your body, you are contracting rather than relaxing. - Breathe in and slowly exhale. "Belly breath" by expanding your belly towards your belly button. Keep belly expanded as you gently direct pressure down and back to the anus.  A low pitched GRRR sound can assist with increasing intra-abdominal pressure.  -  Repeat 3-4 times. If unsuccessful, contract the pelvic floor to restore normal tone and get off the toilet.  Avoid excessive straining. °- To reduce excessive wiping by teaching your anus to normally contract, place hands on outer aspect of knees and resist knee movement outward.  Hold 5-10 second then place hands just inside of knees and resist inward movement of knees.  Hold 5 seconds.  Repeat a few times each way. ° °Due to recent changes in healthcare laws, you may see the results of your imaging and laboratory studies on MyChart before your provider has had a chance  to review them.  We understand that in some cases there may be results that are confusing or concerning to you. Not all laboratory results come back in the same time frame and the provider may be waiting for multiple results in order to interpret others.  Please give us 48 hours in order for your provider to thoroughly review all the results before contacting the office for clarification of your results.  ° °Thank you for choosing me and [facility]. ° °Dr. Mansouraty ° °

## 2021-05-21 ENCOUNTER — Other Ambulatory Visit: Payer: Self-pay

## 2021-05-21 DIAGNOSIS — Z9229 Personal history of other drug therapy: Secondary | ICD-10-CM

## 2021-05-21 DIAGNOSIS — K5909 Other constipation: Secondary | ICD-10-CM

## 2021-05-21 DIAGNOSIS — K439 Ventral hernia without obstruction or gangrene: Secondary | ICD-10-CM

## 2021-05-21 DIAGNOSIS — Z862 Personal history of diseases of the blood and blood-forming organs and certain disorders involving the immune mechanism: Secondary | ICD-10-CM

## 2021-05-21 DIAGNOSIS — R1084 Generalized abdominal pain: Secondary | ICD-10-CM

## 2021-05-21 DIAGNOSIS — R109 Unspecified abdominal pain: Secondary | ICD-10-CM

## 2021-05-21 MED ORDER — AMBULATORY NON FORMULARY MEDICATION: 0 refills | Status: AC

## 2021-05-21 NOTE — Addendum Note (Signed)
Addended byDebbe Mounts on: 05/21/2021 01:33 PM   Modules accepted: Orders

## 2021-06-03 ENCOUNTER — Ambulatory Visit (HOSPITAL_COMMUNITY)
Admission: RE | Admit: 2021-06-03 | Discharge: 2021-06-03 | Disposition: A | Discharge status: Home / Self Care | Payer: Medicare Other | Source: Ambulatory Visit | Attending: Gastroenterology | Admitting: Gastroenterology

## 2021-06-03 ENCOUNTER — Other Ambulatory Visit: Payer: Self-pay

## 2021-06-03 ENCOUNTER — Ambulatory Visit (HOSPITAL_COMMUNITY): Payer: Medicare Other

## 2021-06-03 DIAGNOSIS — R1084 Generalized abdominal pain: Secondary | ICD-10-CM | POA: Insufficient documentation

## 2021-06-03 DIAGNOSIS — K439 Ventral hernia without obstruction or gangrene: Secondary | ICD-10-CM | POA: Diagnosis present

## 2021-06-03 DIAGNOSIS — Z862 Personal history of diseases of the blood and blood-forming organs and certain disorders involving the immune mechanism: Secondary | ICD-10-CM | POA: Insufficient documentation

## 2021-06-03 MED ORDER — SODIUM CHLORIDE (PF) 0.9 % IJ SOLN
INTRAMUSCULAR | Status: AC
  Filled 2021-06-03: qty 50

## 2021-06-03 MED ORDER — IOHEXOL 300 MG/ML  SOLN
80.0000 mL | Freq: Once | INTRAMUSCULAR | Status: AC | PRN
  Administered 2021-06-03: 80 mL via INTRAVENOUS

## 2021-06-04 ENCOUNTER — Other Ambulatory Visit: Payer: Self-pay

## 2021-06-04 DIAGNOSIS — R1084 Generalized abdominal pain: Secondary | ICD-10-CM

## 2021-06-04 DIAGNOSIS — Z862 Personal history of diseases of the blood and blood-forming organs and certain disorders involving the immune mechanism: Secondary | ICD-10-CM

## 2021-06-13 ENCOUNTER — Other Ambulatory Visit (INDEPENDENT_AMBULATORY_CARE_PROVIDER_SITE_OTHER): Payer: Medicare Other

## 2021-06-13 DIAGNOSIS — Z862 Personal history of diseases of the blood and blood-forming organs and certain disorders involving the immune mechanism: Secondary | ICD-10-CM | POA: Diagnosis not present

## 2021-06-13 DIAGNOSIS — R1084 Generalized abdominal pain: Secondary | ICD-10-CM

## 2021-06-13 LAB — IBC + FERRITIN
Ferritin: 152.1 ng/mL (ref 22.0–322.0)
Iron: 68 ug/dL (ref 42–165)
Saturation Ratios: 28.7 % (ref 20.0–50.0)
TIBC: 236.6 ug/dL — ABNORMAL LOW (ref 250.0–450.0)
Transferrin: 169 mg/dL — ABNORMAL LOW (ref 212.0–360.0)

## 2021-06-13 LAB — CBC WITH DIFFERENTIAL/PLATELET
Basophils Absolute: 0 10*3/uL (ref 0.0–0.1)
Basophils Relative: 0.3 % (ref 0.0–3.0)
Eosinophils Absolute: 0.3 10*3/uL (ref 0.0–0.7)
Eosinophils Relative: 3.5 % (ref 0.0–5.0)
HCT: 34.3 % — ABNORMAL LOW (ref 39.0–52.0)
Hemoglobin: 11.3 g/dL — ABNORMAL LOW (ref 13.0–17.0)
Lymphocytes Relative: 20.1 % (ref 12.0–46.0)
Lymphs Abs: 1.6 10*3/uL (ref 0.7–4.0)
MCHC: 33.1 g/dL (ref 30.0–36.0)
MCV: 92.5 fl (ref 78.0–100.0)
Monocytes Absolute: 0.7 10*3/uL (ref 0.1–1.0)
Monocytes Relative: 8.4 % (ref 3.0–12.0)
Neutro Abs: 5.3 10*3/uL (ref 1.4–7.7)
Neutrophils Relative %: 67.7 % (ref 43.0–77.0)
Platelets: 176 10*3/uL (ref 150.0–400.0)
RBC: 3.71 Mil/uL — ABNORMAL LOW (ref 4.22–5.81)
RDW: 15.2 % (ref 11.5–15.5)
WBC: 7.8 10*3/uL (ref 4.0–10.5)

## 2021-06-16 ENCOUNTER — Other Ambulatory Visit: Payer: Self-pay

## 2021-06-16 DIAGNOSIS — Z862 Personal history of diseases of the blood and blood-forming organs and certain disorders involving the immune mechanism: Secondary | ICD-10-CM

## 2021-06-20 ENCOUNTER — Ambulatory Visit: Payer: Medicare Other | Admitting: Gastroenterology

## 2021-06-23 ENCOUNTER — Other Ambulatory Visit: Payer: Self-pay

## 2021-06-23 ENCOUNTER — Telehealth: Payer: Self-pay

## 2021-06-23 DIAGNOSIS — Z862 Personal history of diseases of the blood and blood-forming organs and certain disorders involving the immune mechanism: Secondary | ICD-10-CM

## 2021-06-23 NOTE — Telephone Encounter (Signed)
Unable to reach pt, left vm to call back. Labs ordered per GM.

## 2021-06-23 NOTE — Telephone Encounter (Signed)
-----   Message from Timothy Lasso, RN sent at 05/21/2021  4:45 PM EST ----- I would plan a 1 month CBC/iron/TIBC/ferritin to be drawn and he can have those done through the [facility] system.  Randen Kauth, please work on arranging this.  Thanks.  GM

## 2021-06-24 NOTE — Telephone Encounter (Signed)
Thanks for update. I had heard that he was looking for a different facility closer to him.  If they need our records we are happy to get him sent over. Thanks. GM

## 2021-06-24 NOTE — Telephone Encounter (Signed)
I spoke with the pt and he tells me that he will no longer be seeing our practice. He says it is too far for him to drive and he will be transferring care to a closer practice.  He is not sure of the name but will have that office request his records.  He did mention that his "kidney" doctor checks his labs and he does not feel that they need to checked again.

## 2022-02-15 HISTORY — PX: ICD IMPLANT: EP1208

## 2022-07-01 ENCOUNTER — Encounter: Payer: Self-pay | Admitting: Ophthalmology

## 2022-07-02 NOTE — Discharge Instructions (Signed)

## 2022-07-03 NOTE — Anesthesia Preprocedure Evaluation (Addendum)
Anesthesia Evaluation  Patient identified by MRN, date of birth, ID band Patient awake    Reviewed: Allergy & Precautions, H&P , NPO status , Patient's Chart, lab work & pertinent test results  Airway Mallampati: II  TM Distance: >3 FB Neck ROM: full    Dental no notable dental hx.    Pulmonary sleep apnea , COPD, former smoker PE 2003   Pulmonary exam normal        Cardiovascular hypertension, + CAD, + Past MI (2007), + Peripheral Vascular Disease and +CHF (HFrEF 30% EF)  Normal cardiovascular exam+ dysrhythmias (pAF, sick sinus syndrome) + Cardiac Defibrillator      Neuro/Psych CVA (2012,2019 R arm numbness)  negative psych ROS   GI/Hepatic negative GI ROS, Neg liver ROS,,,  Endo/Other  negative endocrine ROSdiabetes    Renal/GU Renal disease     Musculoskeletal   Abdominal   Peds  Hematology  (+) Blood dyscrasia, anemia   Anesthesia Other Findings Past Medical History: No date: AICD (automatic cardioverter/defibrillator) present No date: Anemia No date: CAD (coronary artery disease) No date: Chronic systolic heart failure (HCC) No date: Chronic venous insufficiency No date: CKD (chronic kidney disease)     Comment:  Stage 3 No date: COPD (chronic obstructive pulmonary disease) (HCC) No date: Diabetes ([place]) 1979: DVT (deep venous thrombosis) ([place])     Comment:  Left Leg No date: Dyslipidemia No date: Hypertension No date: Kidney stones 05/18/2005: Myocardial infarction ([place]) No date: Obstructive sleep apnea No date: PAF (paroxysmal atrial fibrillation) ([place]) No date: Peripheral artery disease ([place]) No date: Prostate cancer ([place]) 2003: Pulmonary embolism ([place]) No date: Sick sinus syndrome ([place]) No date: Stroke [facility])     Comment:  2012, 2019  Some right arm numbness  Past Surgical History: No date: CARDIAC CATHETERIZATION     Comment:  2007,  2023 No date: CHOLECYSTECTOMY No date:  COLONOSCOPY 08/09/2015: CORONARY ARTERY BYPASS GRAFT     Comment:  4 vessel  [facility] No date: FINGER SURGERY; Right 02/2022: ICD IMPLANT No date: KNEE SURGERY     Comment:  R knee x 2, L knee x 1 No date: ROTATOR CUFF REPAIR; Left No date: UMBILICAL HERNIA REPAIR  BMI    Body Mass Index: 30.41 kg/m      Reproductive/Obstetrics negative OB ROS                             Anesthesia Physical Anesthesia Plan  ASA: 3  Anesthesia Plan: MAC   Post-op Pain Management:    Induction:   PONV Risk Score and Plan:   Airway Management Planned: Natural Airway  Additional Equipment:   Intra-op Plan:   Post-operative Plan:   Informed Consent:   Plan Discussed with: Anesthesiologist, CRNA and Surgeon  Anesthesia Plan Comments:         Anesthesia Quick Evaluation

## 2022-07-06 ENCOUNTER — Ambulatory Visit
Admission: RE | Admit: 2022-07-06 | Discharge: 2022-07-06 | Disposition: A | Discharge status: Home / Self Care | Payer: Medicare Other | Attending: Ophthalmology | Admitting: Ophthalmology

## 2022-07-06 ENCOUNTER — Encounter
Admission: RE | Disposition: A | Discharge status: Home / Self Care | Payer: Self-pay | Source: Home / Self Care | Attending: Ophthalmology

## 2022-07-06 ENCOUNTER — Other Ambulatory Visit: Payer: Self-pay

## 2022-07-06 ENCOUNTER — Ambulatory Visit: Payer: Medicare Other | Admitting: Anesthesiology

## 2022-07-06 DIAGNOSIS — E1136 Type 2 diabetes mellitus with diabetic cataract: Secondary | ICD-10-CM | POA: Insufficient documentation

## 2022-07-06 DIAGNOSIS — J449 Chronic obstructive pulmonary disease, unspecified: Secondary | ICD-10-CM | POA: Insufficient documentation

## 2022-07-06 DIAGNOSIS — Z86718 Personal history of other venous thrombosis and embolism: Secondary | ICD-10-CM | POA: Diagnosis not present

## 2022-07-06 DIAGNOSIS — I252 Old myocardial infarction: Secondary | ICD-10-CM | POA: Diagnosis not present

## 2022-07-06 DIAGNOSIS — Z86711 Personal history of pulmonary embolism: Secondary | ICD-10-CM | POA: Insufficient documentation

## 2022-07-06 DIAGNOSIS — Z87891 Personal history of nicotine dependence: Secondary | ICD-10-CM | POA: Diagnosis not present

## 2022-07-06 DIAGNOSIS — N183 Chronic kidney disease, stage 3 unspecified: Secondary | ICD-10-CM | POA: Diagnosis not present

## 2022-07-06 DIAGNOSIS — G4733 Obstructive sleep apnea (adult) (pediatric): Secondary | ICD-10-CM | POA: Insufficient documentation

## 2022-07-06 DIAGNOSIS — I480 Paroxysmal atrial fibrillation: Secondary | ICD-10-CM | POA: Insufficient documentation

## 2022-07-06 DIAGNOSIS — H2511 Age-related nuclear cataract, right eye: Secondary | ICD-10-CM | POA: Diagnosis not present

## 2022-07-06 DIAGNOSIS — Z95810 Presence of automatic (implantable) cardiac defibrillator: Secondary | ICD-10-CM | POA: Diagnosis not present

## 2022-07-06 DIAGNOSIS — I2510 Atherosclerotic heart disease of native coronary artery without angina pectoris: Secondary | ICD-10-CM | POA: Diagnosis not present

## 2022-07-06 DIAGNOSIS — I130 Hypertensive heart and chronic kidney disease with heart failure and stage 1 through stage 4 chronic kidney disease, or unspecified chronic kidney disease: Secondary | ICD-10-CM | POA: Diagnosis not present

## 2022-07-06 DIAGNOSIS — Z951 Presence of aortocoronary bypass graft: Secondary | ICD-10-CM | POA: Diagnosis not present

## 2022-07-06 DIAGNOSIS — Z8546 Personal history of malignant neoplasm of prostate: Secondary | ICD-10-CM | POA: Insufficient documentation

## 2022-07-06 DIAGNOSIS — I69398 Other sequelae of cerebral infarction: Secondary | ICD-10-CM | POA: Diagnosis not present

## 2022-07-06 DIAGNOSIS — Z7984 Long term (current) use of oral hypoglycemic drugs: Secondary | ICD-10-CM | POA: Insufficient documentation

## 2022-07-06 DIAGNOSIS — I495 Sick sinus syndrome: Secondary | ICD-10-CM | POA: Diagnosis not present

## 2022-07-06 DIAGNOSIS — R200 Anesthesia of skin: Secondary | ICD-10-CM | POA: Diagnosis not present

## 2022-07-06 DIAGNOSIS — E119 Type 2 diabetes mellitus without complications: Secondary | ICD-10-CM

## 2022-07-06 DIAGNOSIS — I5022 Chronic systolic (congestive) heart failure: Secondary | ICD-10-CM | POA: Insufficient documentation

## 2022-07-06 HISTORY — DX: Chronic systolic (congestive) heart failure: I50.22

## 2022-07-06 HISTORY — DX: Venous insufficiency (chronic) (peripheral): I87.2

## 2022-07-06 HISTORY — DX: Malignant neoplasm of prostate: C61

## 2022-07-06 HISTORY — DX: Chronic obstructive pulmonary disease, unspecified: J44.9

## 2022-07-06 HISTORY — DX: Paroxysmal atrial fibrillation: I48.0

## 2022-07-06 HISTORY — DX: Presence of automatic (implantable) cardiac defibrillator: Z95.810

## 2022-07-06 HISTORY — DX: Sick sinus syndrome: I49.5

## 2022-07-06 HISTORY — DX: Peripheral vascular disease, unspecified: I73.9

## 2022-07-06 HISTORY — PX: CATARACT EXTRACTION W/PHACO: SHX586

## 2022-07-06 LAB — GLUCOSE, CAPILLARY: Glucose-Capillary: 166 mg/dL — ABNORMAL HIGH (ref 70–99)

## 2022-07-06 SURGERY — PHACOEMULSIFICATION, CATARACT, WITH IOL INSERTION
Anesthesia: Monitor Anesthesia Care | Site: Eye | Laterality: Right

## 2022-07-06 MED ORDER — SIGHTPATH DOSE#1 BSS IO SOLN
INTRAOCULAR | Status: DC | PRN
  Administered 2022-07-06: 15 mL

## 2022-07-06 MED ORDER — MIDAZOLAM HCL 2 MG/2ML IJ SOLN
INTRAMUSCULAR | Status: DC | PRN
  Administered 2022-07-06: 1 mg via INTRAVENOUS

## 2022-07-06 MED ORDER — LIDOCAINE HCL (PF) 2 % IJ SOLN
INTRAOCULAR | Status: DC | PRN
  Administered 2022-07-06: 1 mL via INTRAOCULAR

## 2022-07-06 MED ORDER — ARMC OPHTHALMIC DILATING DROPS
1.0000 | OPHTHALMIC | Status: DC | PRN
  Administered 2022-07-06 (×3): 1 via OPHTHALMIC

## 2022-07-06 MED ORDER — TETRACAINE HCL 0.5 % OP SOLN
1.0000 [drp] | OPHTHALMIC | Status: DC | PRN
  Administered 2022-07-06 (×3): 1 [drp] via OPHTHALMIC

## 2022-07-06 MED ORDER — SIGHTPATH DOSE#1 NA HYALUR & NA CHOND-NA HYALUR IO KIT
PACK | INTRAOCULAR | Status: DC | PRN
  Administered 2022-07-06: 1 via OPHTHALMIC

## 2022-07-06 MED ORDER — SIGHTPATH DOSE#1 BSS IO SOLN
INTRAOCULAR | Status: DC | PRN
  Administered 2022-07-06: 90 mL via OPHTHALMIC

## 2022-07-06 MED ORDER — MOXIFLOXACIN HCL 0.5 % OP SOLN
OPHTHALMIC | Status: DC | PRN
  Administered 2022-07-06: .2 mL via OPHTHALMIC

## 2022-07-06 SURGICAL SUPPLY — 13 items
CATARACT SUITE SIGHTPATH (MISCELLANEOUS) ×1 IMPLANT
DISSECTOR HYDRO NUCLEUS 50X22 (MISCELLANEOUS) ×1 IMPLANT
FEE CATARACT SUITE SIGHTPATH (MISCELLANEOUS) ×1 IMPLANT
GLOVE SURG GAMMEX PI TX LF 7.5 (GLOVE) ×1 IMPLANT
GLOVE SURG SYN 8.5  E (GLOVE) ×1
GLOVE SURG SYN 8.5 E (GLOVE) ×1 IMPLANT
GLOVE SURG SYN 8.5 PF PI (GLOVE) ×1 IMPLANT
LENS IOL TECNIS EYHANCE 20.5 (Intraocular Lens) IMPLANT
NDL FILTER BLUNT 18X1 1/2 (NEEDLE) ×1 IMPLANT
NEEDLE FILTER BLUNT 18X1 1/2 (NEEDLE) ×1 IMPLANT
SYR 3ML LL SCALE MARK (SYRINGE) ×1 IMPLANT
SYR 5ML LL (SYRINGE) ×1 IMPLANT
WATER STERILE IRR 250ML POUR (IV SOLUTION) ×1 IMPLANT

## 2022-07-06 NOTE — H&P (Signed)
[facility]   Primary Care Physician:  Coolidge Breeze, FNP Ophthalmologist: Dr. Benay Pillow  Pre-Procedure History & Physical: HPI:  Tom Bartlett is a 77 y.o. male here for cataract surgery.   Past Medical History:  Diagnosis Date   AICD (automatic cardioverter/defibrillator) present    Anemia    CAD (coronary artery disease)    Chronic systolic heart failure (HCC)    Chronic venous insufficiency    CKD (chronic kidney disease)    Stage 3   COPD (chronic obstructive pulmonary disease) (HCC)    Diabetes ([place])    DVT (deep venous thrombosis) ([place]) 1979   Left Leg   Dyslipidemia    Hypertension    Kidney stones    Myocardial infarction ([place]) 05/18/2005   Obstructive sleep apnea    PAF (paroxysmal atrial fibrillation) ([place])    Peripheral artery disease ([place])    Prostate cancer ([place])    Pulmonary embolism ([place]) 2003   Sick sinus syndrome ([place])    Stroke ([place])    2012, 2019  Some right arm numbness    Past Surgical History:  Procedure Laterality Date   CARDIAC CATHETERIZATION     2007,  2023   CHOLECYSTECTOMY     COLONOSCOPY     CORONARY ARTERY BYPASS GRAFT  08/09/2015   4 vessel  [facility]   FINGER SURGERY Right    ICD IMPLANT  02/2022   KNEE SURGERY     R knee x 2, L knee x 1   ROTATOR CUFF REPAIR Left    UMBILICAL HERNIA REPAIR      Prior to Admission medications   Medication Sig Start Date End Date Taking? Authorizing Provider  acetaminophen (TYLENOL) 325 MG tablet Take 2 tablets by mouth as needed.   Yes [provider]  amLODipine (NORVASC) 2.5 MG tablet Take 2.5 mg by mouth at bedtime.   Yes [provider]  ASPIRIN 81 PO Take by mouth daily.   Yes [provider]  Calcium Polycarbophil (FIBER) 625 MG TABS Take by mouth.   Yes [provider]  empagliflozin (JARDIANCE) 10 MG TABS tablet 1 tablet   Yes [provider]  Iron-FA-B Cmp-C-Biot-Probiotic (FUSION PLUS) CAPS Take 1 capsule by mouth daily. 04/02/21   Yes [provider]  metoprolol tartrate (LOPRESSOR) 50 MG tablet Take 50 mg by mouth 2 (two) times daily.   Yes [provider]  Multiple Vitamins-Minerals (EYE HEALTH) CAPS Take 1 capsule by mouth in the morning and at bedtime.   Yes [provider]  Niacin (VITAMIN B-3 PO) Take 2 tablets by mouth daily.   Yes [provider]  Omega-3 Fatty Acids (FISH OIL) 1200 MG CAPS Take 1 capsule by mouth in the morning and at bedtime.   Yes [provider]  sacubitril-valsartan (ENTRESTO) 49-51 MG daily. 09/27/20  Yes [provider]  sodium bicarbonate 650 MG tablet Take 650 mg by mouth daily.   Yes [provider]  spironolactone (ALDACTONE) 25 MG tablet Take 25 mg by mouth daily.   Yes [provider]  torsemide (DEMADEX) 20 MG tablet Take 20 mg by mouth every other day. 06/04/20  Yes [provider]  vitamin B-12 (CYANOCOBALAMIN) 100 MCG tablet Take 100 mcg by mouth daily.   Yes [provider]  XARELTO 2.5 MG TABS tablet Take 15 mg by mouth daily. 08/13/20  Yes [provider]  AMBULATORY NON FORMULARY MEDICATION Abdominal Binder - use as directed 05/21/21  Mansouraty, Telford Nab., MD  carvedilol (COREG) 25 MG tablet Take 12.5 mg by mouth 2 (two) times daily. Patient not taking: Reported on 07/01/2022 06/27/20   [provider]    Allergies as of 06/16/2022 - Review Complete 05/20/2021  Allergen Reaction Noted   Statins Other (See Comments) 08/08/2015    Family History  Problem Relation Age of Onset   Dementia Mother    Diabetes Father    Heart disease Father    Congestive Heart Failure Father    Heart disease Sister    Heart disease Brother    Colon cancer Neg Hx    Stomach cancer Neg Hx    Pancreatic cancer Neg Hx    Esophageal cancer Neg Hx    Inflammatory bowel disease Neg Hx    Liver disease Neg Hx    Rectal cancer Neg Hx     Social History   Socioeconomic History   Marital  status: Married    Spouse name: Not on file   Number of children: Not on file   Years of education: Not on file   Highest education level: Not on file  Occupational History   Not on file  Tobacco Use   Smoking status: Former    Types: Cigarettes   Smokeless tobacco: Never   Tobacco comments:    Quit over 40 yrs ago  Vaping Use   Vaping Use: Never used  Substance and Sexual Activity   Alcohol use: Not Currently    Comment: quit age 28   Drug use: Never   Sexual activity: Not on file  Other Topics Concern   Not on file  Social History Narrative   Not on file   Social Determinants of Health   Financial Resource Strain: Not on file  Food Insecurity: Not on file  Transportation Needs: Not on file  Physical Activity: Not on file  Stress: Not on file  Social Connections: Not on file  Intimate Partner Violence: Not on file    Review of Systems: See HPI, otherwise negative ROS  Physical Exam: Ht 5' 8"$  (1.727 m)   Wt 90.7 kg   BMI 30.41 kg/m  General:   Alert, cooperative in NAD Head:  Normocephalic and atraumatic. Respiratory:  Normal work of breathing. Cardiovascular:  RRR  Impression/Plan: MYLAN DANESE is here for cataract surgery.  Risks, benefits, limitations, and alternatives regarding cataract surgery have been reviewed with the patient.  Questions have been answered.  All parties agreeable.   Benay Pillow, MD  07/06/2022, 10:12 AM

## 2022-07-06 NOTE — Transfer of Care (Signed)
Immediate Anesthesia Transfer of Care Note  Patient: Tom Bartlett  Procedure(s) Performed: CATARACT EXTRACTION PHACO AND INTRAOCULAR LENS PLACEMENT (IOC) RIGHT DIABETIC  2.74  00:24.0 (Right: Eye)  Patient Location: PACU  Anesthesia Type: MAC  Level of Consciousness: awake, alert  and patient cooperative  Airway and Oxygen Therapy: Patient Spontanous Breathing and Patient connected to supplemental oxygen  Post-op Assessment: Post-op Vital signs reviewed, Patient's Cardiovascular Status Stable, Respiratory Function Stable, Patent Airway and No signs of Nausea or vomiting  Post-op Vital Signs: Reviewed and stable  Complications: No notable events documented.

## 2022-07-06 NOTE — Anesthesia Postprocedure Evaluation (Signed)
Anesthesia Post Note  Patient: Tom Bartlett  Procedure(s) Performed: CATARACT EXTRACTION PHACO AND INTRAOCULAR LENS PLACEMENT (IOC) RIGHT DIABETIC  2.74  00:24.0 (Right: Eye)  Patient location during evaluation: PACU Anesthesia Type: MAC Level of consciousness: awake and alert Pain management: pain level controlled Vital Signs Assessment: post-procedure vital signs reviewed and stable Respiratory status: spontaneous breathing, nonlabored ventilation and respiratory function stable Cardiovascular status: blood pressure returned to baseline and stable Postop Assessment: no apparent nausea or vomiting Anesthetic complications: no   No notable events documented.   Last Vitals:  Vitals:   07/06/22 1040 07/06/22 1045  BP: 121/62 130/62  Pulse: 60 61  Resp: 10 16  Temp: 36.6 C 36.6 C  SpO2: 97% 97%    Last Pain:  Vitals:   07/06/22 1045  TempSrc:   PainSc: 0-No pain                 Iran Ouch

## 2022-07-06 NOTE — Op Note (Signed)
OPERATIVE NOTE  JHONEN KOSIBA [id number] 07/06/2022   PREOPERATIVE DIAGNOSIS:  Nuclear sclerotic cataract right eye.  H25.11   POSTOPERATIVE DIAGNOSIS:    Nuclear sclerotic cataract right eye.     PROCEDURE:  Phacoemusification with posterior chamber intraocular lens placement of the right eye   LENS:   Implant Name Type Inv. Item Serial No. Manufacturer Lot No. LRB No. Used Action  LENS IOL TECNIS EYHANCE 20.5 - [id number] Intraocular Lens LENS IOL TECNIS EYHANCE 20.5 [id number] [employer]  Right 1 Implanted       Procedure(s) with comments: CATARACT EXTRACTION PHACO AND INTRAOCULAR LENS PLACEMENT (IOC) RIGHT DIABETIC  2.74  00:24.0 (Right) - Diabetic  DIB00 +20.5   ULTRASOUND TIME: 0 minutes 24 seconds.  CDE 2.74   SURGEON:  Benay Pillow, MD, MPH  ANESTHESIOLOGIST: Anesthesiologist: [place] Ouch, MD CRNA: Hilbert Odor, CRNA   ANESTHESIA:  Topical with tetracaine drops augmented with 1% preservative-free intracameral lidocaine.  ESTIMATED BLOOD LOSS: less than 1 mL.   COMPLICATIONS:  None.   DESCRIPTION OF PROCEDURE:  The patient was identified in the holding room and transported to the operating room and placed in the supine position under the operating microscope.  The right eye was identified as the operative eye and it was prepped and draped in the usual sterile ophthalmic fashion.   A 1.0 millimeter clear-corneal paracentesis was made at the 10:30 position. 0.5 ml of preservative-free 1% lidocaine with epinephrine was injected into the anterior chamber.  The anterior chamber was filled with viscoelastic.  A 2.4 millimeter keratome was used to make a near-clear corneal incision at the 8:00 position.  A curvilinear capsulorrhexis was made with a cystotome and capsulorrhexis forceps.  Balanced salt solution was used to hydrodissect and hydrodelineate the nucleus.   Phacoemulsification was then used in stop and chop fashion to remove the lens nucleus and  epinucleus.  The remaining cortex was then removed using the irrigation and aspiration handpiece. Viscoelastic was then placed into the capsular bag to distend it for lens placement.  A lens was then injected into the capsular bag.  The remaining viscoelastic was aspirated.   Wounds were hydrated with balanced salt solution.  The anterior chamber was inflated to a physiologic pressure with balanced salt solution.   Intracameral vigamox 0.1 mL undiluted was injected into the eye and a drop placed onto the ocular surface.  No wound leaks were noted.  The patient was taken to the recovery room in stable condition without complications of anesthesia or surgery  Benay Pillow 07/06/2022, 10:38 AM

## 2022-07-06 NOTE — Anesthesia Procedure Notes (Signed)
Procedure Name: MAC Date/Time: 07/06/2022 10:23 AM  Performed by: Hilbert Odor, CRNAPre-anesthesia Checklist: Patient identified, Emergency Drugs available, Suction available, Patient being monitored and Timeout performed Patient Re-evaluated:Patient Re-evaluated prior to induction Oxygen Delivery Method: Nasal cannula Induction Type: IV induction

## 2022-07-07 ENCOUNTER — Encounter: Payer: Self-pay | Admitting: Ophthalmology

## 2022-07-27 ENCOUNTER — Ambulatory Visit: Admit: 2022-07-27 | Payer: Medicare Other | Admitting: Ophthalmology

## 2022-07-27 SURGERY — PHACOEMULSIFICATION, CATARACT, WITH IOL INSERTION
Anesthesia: Topical | Laterality: Left

## 2023-06-23 NOTE — Discharge Summary (Signed)
 [place] Hospitalist  Discharge Summary   Name: Tom Bartlett Age: 78 yrs  MRN: [id number] DOB: [birth year]  Admit Date: 06/14/2023 Admitting Physician: Otelia Dolly, MD  Discharge Date: 06/23/2023 Discharge Physician: No att. 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 *   TO DO List at Follow-up for PCP/Specialist:  Follow-up with PCP in 1 week Follow-up with nephrology in 1 to 2 weeks Repeat BMP next week     Hospital Course:   Tom Bartlett is a 78 y.o. male with PMHx HTN, HLD, CAD, ICM, HFrEF, chronic Afib on xarelto , MI, CABG x4v, s/p PM and defib, PAD, PVD, Hx PE, CVA, CKD4, T2DM, former smoker, who initially presented to OSH [facility]) following syncopal event at church. ICD showed Vfib arrest s/p 1 shock. Pt transferred to [facility] under hospitalist service on 1/27.  He was monitored in [facility], cardiology were consulted, was started on amiodarone, was seen by EP, device interrogation showed no VT setting turned on, hence it was turned on, no further VT V-fib in hospital.  He underwent cardiac cath on 1/28, for ischemic workup, no culprit was found to explain VT V-fib.  Patient was started on amiodarone.  Patient has been on Toprol -XL, he had brief episode of RVR for which dose of Toprol -XL was increased.   Patient's blood pressure has been soft hence Toprol -XL was changed to home dose prior to discharge with heart rate being stable.  Patient has CKD, with admission creatinine of 1.8 but it turns out he ahas fluctuated between 2 and 3 throughout the last year depending on if he is volume overloaded or on enough diuretic.SABRA  His renal function started to worsen, up to creatinine 4.32.  He was started on IV fluid, which did not help  Nephrology were consulted, he underwent two sessions of HD with improvement in renal function.  Patient did not need further hemodialysis and HD catheter was removed prior to discharge today.  His creatinine today on discharge is 2.35.  He was also diuresed with IV Lasix  for suspected volume overload.  He will need close follow-up with his nephrologist Dr Watt after discharge.  Home diuretics Lasix  40 mg daily was discontinued and patient has been started on torsemide  40 mg p.o. daily as per nephrology recommendation.   Patient also was noted to have hypoxia, he has OSA, is not using CPAP at home, likely related to atelectasis and fluid overload.  Oxygen has been weaned off and patient was on room air.    He was noted to have left foot acute gout, he received 5 days course of oral prednisone which helped resolution of acute gout.      The patient's chronic medical conditions were treated accordingly per the patient's home medication regimen except as noted in the plan above and in  the medication list below.    Discharge Condition:   Disposition: Patient discharged to home with home health in good condition.  Diet at discharge:    Dietary Orders  (From admission, onward)               Ordered    Adult Diet- Consistent Carbohydrate; Med (45-60 gm/meal); CKD (pro/K/Phos/Na restricted)  Diet effective now       References:    Medical Nutrition Management (MNM) for Registered Dietitian  Question Answer Comment  Diet type: Consistent Carbohydrate   Carbohydrate  Restriction: Med (45-60 gm/meal)   Renal Restriction: CKD (pro/K/Phos/Na restricted)   Medical Nutrition Management By RD Yes, Medical Nutrition Management By RD      06/18/23 1245            Activity at Discharge: Ambulate ad lib   Wound 06/14/23 Buttocks Lower;Right (Active)  Date First Assessed/Time First Assessed: 06/14/23 0900   Location: Buttocks  Wound Location Orientation: Lower;Right  Wound Description (Comments): dark brown eschar approx 0.5cm X 0.5 cm no drainage. skin around wound approximated discolored but blan...     Physical Exam at Discharge   BP (!) 119/57   Pulse 60   Temp 98.1 F (36.7 C) (Oral)   Resp 20   Ht 1.727 m (5' 8)   Wt 92 kg (202 lb 13.2 oz)   SpO2 94%   BMI 30.84 kg/m   Gen- awake, alert, not in acute distress  Head-  atraumatic, normocephalic  ENT- normal oral  mucosa  Respiratory- B/l CTA, no wheezing, no crackles  CVS- s1s2+ irregularly irregular GI- soft, BS+, non tender Neuro- no FND, following all commands, moving all extremities Skin- normal turgor, trialysis catheter present in right side of the neck, dressing with some oozing, no active bleeding Psych- normal mood  Extremities-bilateral trace pedal edema, no calf asymmetry   Discharge Medications:      Medication List     START taking these medications    amiodarone 200 mg tablet Commonly known as: PACERONE Take 1 tablet (200 mg total) by mouth 2 (two) times a day.   torsemide  40 mg Tab Take 1 tablet (40 mg total) by mouth daily. Start taking on: June 24, 2023       CONTINUE taking these medications    acetaminophen  325 mg tablet Commonly known as: TYLENOL  Take 2 tablets by mouth as needed.   cholecalciferol 25 mcg (1,000 unit) Cap Take 2,000 Units by mouth daily.   cyanocobalamin  (vitamin B-12) 1,000 mcg Cap Take 1,000 mcg by mouth daily.   FiberCon 625 mg tablet Generic drug: polycarbophil Take 625 mg by mouth 2 (two) times a day.   Fusion  Plus 130 mg iron -1,250 mcg Cap Generic drug: iron fum,ps-FA-vit B,C#18-Lact Take 1 tablet by mouth daily.   glipiZIDE 2.5 mg Tab Take 2.5 mg by mouth daily before breakfast.   isosorbide mononitrate 30 mg 24 hr tablet Commonly known as: IMDUR Take 30 mg by mouth daily.   metoprolol  succinate 50 mg 24 hr tablet Commonly known as: TOPROL  XL Take 50 mg by mouth 2 (two) times a day.   Ocuvite Lutein and Zeaxanthin 60 mg-13.5 mg- 15 mg-2 mg-6 mg capsule Generic drug: vit C,E-Zn-copper-lutein-zeaxanthin Take 1 capsule by mouth 2 (two) times a day.   Xarelto  15 mg tablet Generic drug: rivaroxaban  Take 15 mg by mouth daily.       STOP taking these medications    furosemide  40 mg tablet Commonly  known as: LASIX          Where to Get Your Medications     These medications were sent to [id number] [place], [place] - [address] AT [facility] - PHONE: [phone] - FAX: [phone]  [address], [place] [address]    Phone: [phone]  amiodarone 200 mg tablet torsemide  40 mg Tab     Significant Diagnostic Tests:   LABS:  Lab Results  Component Value Date   WBC 15.16 (H) 06/22/2023   HGB 10.1 (L) 06/22/2023   HCT 29.9 (L) 06/22/2023   PLT 184 06/22/2023   ALT 16 06/14/2023   AST 18 06/14/2023   NA 136 06/23/2023   K 3.5 06/23/2023   CL 103 06/23/2023   CREATININE 2.35 (H) 06/23/2023   BUN 76 (H) 06/23/2023   CO2 26 06/23/2023   TSH 0.453 06/14/2023   HGBA1C 6.8 (H) 06/14/2023   IMAGING:  XR Chest 1 View  Final Result by Delmar Herbert Winfred Booker Results In [place] [id number] (02/04 0349)  CLINICAL DATA:  Line placement    EXAM:  CHEST  1 VIEW    COMPARISON:  06/17/2023    FINDINGS:  Right IJ approach central venous catheter tip is in the proximal  right atrium. There is right basilar atelectasis. Moderate  cardiomegaly. Unchanged position of left chest wall AICD leads.  Remote median sternotomy.    IMPRESSION:  Right IJ approach  central venous catheter tip in the proximal right  atrium.      Electronically Signed    By: Franky Stanford M.D.    On: 06/22/2023 03:49      IR Vascath Insertion  Final Result by Juliene Bernardino Balder, MD (01/31 1645)  INDICATION:  Catheter needed for hemodialysis.    EXAM:  FLUOROSCOPIC AND ULTRASOUND GUIDED PLACEMENT OF A NON-TUNNELED  DIALYSIS CATHETER    Physician: Juliene SAUNDERS. Balder, MD    MEDICATIONS:  1% lidocaine  for local anesthetic    ANESTHESIA/SEDATION:  None    FLUOROSCOPY TIME:  Radiation Exposure Index (as provided by the  fluoroscopic device): 9.9 mGy Kerma    COMPLICATIONS:  None immediate.    PROCEDURE:  Informed consent was obtained for catheter placement. The patient  was placed supine on the interventional table. Ultrasound confirmed  a patent right internal jugular vein. Ultrasound images were  obtained for documentation. The right neck was prepped and draped in  a sterile fashion. Maximal barrier sterile technique was utilized  including caps, mask, sterile gowns, sterile gloves, sterile drape,  hand hygiene and skin antiseptic. The right neck was anesthetized  with 1% lidocaine . A small incision was made with #11 blade scalpel.  A 21 gauge needle directed into the right internal jugular vein with  ultrasound guidance. A micropuncture dilator set was placed. A 20 cm  Trialysis catheter was selected. The catheter was advanced over a  wire and positioned in the upper right atrium. Fluoroscopic images  were obtained for documentation. Both dialysis lumens were found to  aspirate and flush well. The proper amount of heparin was flushed in  both lumens. The central venous lumen was flushed with normal  saline. Catheter was sutured to skin.    FINDINGS:  Catheter tip in the upper right atrium.    IMPRESSION:  Successful placement of a right jugular non-tunneled dialysis  catheter using ultrasound and fluoroscopic guidance.      Electronically Signed     By: Juliene Balder HERO.D.  On: 06/18/2023 16:45      US  Renal Bilateral Complete  Final Result by Delmar Herbert Winfred Booker Results In [place] [id number] (01/30 1742)  CLINICAL DATA:  Acute kidney insufficiency on chronic    EXAM:  RENAL / URINARY TRACT ULTRASOUND COMPLETE    COMPARISON:  CT scan 06/03/2021    FINDINGS:  Right Kidney:    Renal measurements: 11.5 x 5.1 x 5.4 cm = volume: 164.5 mL. Global  atrophy. No collecting system dilatation. There is an anechoic  exophytic structure with through transmission from the right kidney  measuring 2.3 x 2.0 x 1.7 cm, similar to previous CT.    Left Kidney:    Renal measurements: 11.7 x 5.0 x 5.6 cm = volume: 171 mL. Mild  parenchymal atrophy. No collecting system dilatation. There is an  anechoic structure in the left kidney measuring 15 mm similar to  prior examination. Benign cyst. This does have some through  transmission.    Bladder:    Underdistended.    Other:    None.    IMPRESSION:  No collecting system dilatation.  Bilateral renal atrophy.    Underdistended urinary bladder.      Electronically Signed    By: Ranell Bring M.D.    On: 06/17/2023 17:42      XR Chest 1 View  Final Result by Delmar Herbert Winfred Booker Results In [place] [id number] (01/30 1743)  CLINICAL DATA:  Hypoxia    EXAM:  CHEST  1 VIEW    COMPARISON:  X-ray 10/11/2020    FINDINGS:  Sternal wires. Defibrillator. Enlarged cardiopericardial silhouette.  Prominent vasculature. Calcified aorta. No pneumothorax or edema.  Small right effusion. There is some mild opacity at the right lung  base. Atelectasis versus infiltrate. Degenerative changes seen along  the spine. Degenerative changes of the spine.    IMPRESSION:  Postop chest. Enlarged cardiopericardial silhouette with vascular  congestion.    Small right effusion with some adjacent opacity. Atelectasis versus  infiltrate. Recommend follow-up.      Electronically Signed    By: Ranell Bring M.D.    On: 06/17/2023  17:43      XR Foot 2 Views Left  Final Result by Delmar Herbert Winfred Booker Results In [place] [id number] (01/28 1525)  CLINICAL DATA:  Left foot pain.    EXAM:  LEFT FOOT - 2 VIEW    COMPARISON:  None Available.    FINDINGS:  Mild plantar and posterior calcaneal heel spurs. Mild dorsal  talonavicular degenerative spurring. Mild hallux valgus. No  significant joint space narrowing. No acute fracture or dislocation.    IMPRESSION:  1. Mild plantar and posterior calcaneal heel spurs.  2. Mild hallux valgus.      Electronically Signed    By: Tanda Lyons M.D.    On: 06/15/2023 15:25      Transthoracic echo (TTE) complete  Final Result by Carlin Hoe, MD (01/28 1309)                                                    [facility]                                                  [facility]                                                    Cardiac                                                  Ultrasound-                                                  [place],                                                 [facility] Center                                                     Bldg                                                   [address]                                                  [place]  [place] [address]                                   Transthoracic Echocardiogram Report  Name  LUNDEN, MCLEISH                         Study Date  06-15-2023                 Height  68 in  MRN  [id number]                                    Patient Location    [id number]         Weight  187 lb  DOB  Oct 25, 1945                                  Gender  Male                           BSA  2.0 m2  Age  47 yrs                                       Ethnicity  1  Reason For Study  Ventricular fibrillation  VF                                          HR  60  History  vfib  Ordering Physician  JERALD PEYTON NORRIS                                        Performed By  CORRINE RIGGS  Referring Physician  COX, BRITTANY NICOLE  -  -  PROCEDURE  A two-dimensional transthoracic echocardiogram with color flow and Doppler   was performed. Definity  echocontrast utilized for endocardial border enhancement. patient had back   spasms after definity was  administered.  -  SUMMARY  The left ventricle is severely dilated.  There is normal left ventricular wall thickness.  Left ventricular systolic function is severely reduced.  LV ejection fraction = 20-25%.  There is severe global hypokinesis of the left ventricle.  The right ventricle is not well visualized.  Device lead in the right ventricle  The left atrium is severely dilated.  There is mild mitral regurgitation.  There is mild to moderate tricuspid regurgitation.  Moderate to severe pulmonary hypertension.  The aortic sinus is normal size.  Dilated IVC consistent with elevated RA pressure.  There is no pericardial effusion.  There is no comparison study available.  -  FINDINGS   LEFT VENTRICLE  The left ventricle is severely dilated. There is normal left ventricular   wall thickness. Left  ventricular systolic function is severely reduced. LV ejection fraction =   20-25%. Left ventricular  filling pattern is indeterminate. There is severe global hypokinesis of   the left ventricle.  -  RIGHT VENTRICLE  The  right ventricle is not well visualized. Device lead in the right   ventricle.  LEFT ATRIUM  The left atrium is severely dilated.  RIGHT ATRIUM  Right atrium not well visualized.  -  AORTIC VALVE  The aortic valve is trileaflet. Aortic valve calcification. There is no   aortic stenosis. There is  trace aortic regurgitation.  -   MITRAL VALVE  There is mild to moderate mitral valve thickening. There is mild mitral   regurgitation.  -  TRICUSPID VALVE  The tricuspid valve is normal in structure and function. There is mild to   moderate tricuspid  regurgitation. Estimated right ventricular systolic pressure is 65 mmHg.   Moderate to severe  pulmonary hypertension.  -  PULMONIC VALVE  Trace pulmonic valvular regurgitation.  -  ARTERIES  The aortic sinus is normal size. The ascending aorta is normal size.  -  VENOUS  Dilated IVC consistent with elevated RA pressure.  -  EFFUSION  There is no pericardial effusion.  -  -  MMode-2D Measurements & Calculations  IVSd  1.0 cm                        LA dim  4.6 cm   asc Aorta Diam  3.8   cm     LVOT diam  2.1 cm  LVIDd  6.2 cm  LVPWd  1.0 cm  LVIDs  5.4 cm              ___________________________________________________________________________  ______  LA ESV Index  A4C   51.3 ml-m2  Doppler Measurements & Calculations  MV E max vel  126.0 cm-sec MV dec time  0.26 sec   SV LVOT   64.9 ml        LV V1 VTI  17.9 cm  MV A max vel  81.0 cm-sec                          Ao V2 max  158.7 cm-sec  MV E-A  1.6                                        Ao max PG  10.1 mmHg  Lat Peak E  Vel  4.1 cm-sec  E-Lat E`  30.5              ___________________________________________________________________________  ______  PA max PG  5.1 mmHg        TR max vel  378.7 cm-secRAP systole  8.0 mmHg    SV index LVOT   32.7 ml-m2                             TR max PG  57.4 mmHg                             RVSP TR   65.4 mmHg  ___________________________________________________________________________  ___  Reading Physician                     MD Carlin Hoe, MD, [phone] 06-15-2023 01 09 PM    Cardiac catheterization  Final Result by Alverna Lamar Sieving, DO (01/28 1029)    PERFORMING CARDIOLOGIST: Alverna Sieving, DO [facility]     INDICATIONS:  Arrhythmia  - VT/VF    ACCESS SITE:  Left radial artery    PROCEDURE:  Coronary angiography, SV Graft angiography, and IM Artery    Angiography.  The aortic valve was not crossed.    DIAGNOSTIC FINDINGS:     1.  Patent LIMA-LAD and patent SVG-OM 3  2.  Chronically occluded SVG-OM and SVG-RCA (as documented previously at   [facility])  3.  Native CAD is in the diagram.  4.  The coronary anatomy appears unchanged compared to the results from   the last heart catheterization at [facility] in 2022.    COMPLICATIONS:  None    EBL: <25 mL    RECOMMENDATIONS:   The findings are consistent with stable CAD.  No culprit lesion was found   to explain the recent VT/VF episode.  The patient should continue with medical therapy to reduce future CVD   events.  The patient is being further evaluated by EP to reduce future ventricular   dysrhythmia.        Alverna Sieving, DO [facility]  Interventional Cardiology   [facility]  06/15/2023  10:36 AM           Surgeries/Procedures:   Procedure(s) (LRB): CV CATHETERIZATION LEFT HEART WITH BYPASS GRAFTS (Left)  Consults:   IP CONSULT TO CARDIOLOGY IP CONSULT TO NEPHROLOGY   Follow-up Appointments:    No future appointments.       Electronically signed by: Wilnette Overman, MD 06/23/2023 7:36 PM Time spent on discharge: 42  minutes.

## 2023-06-24 NOTE — Progress Notes (Signed)
 Case Management Discharge Note        CSN: [id number] DOB: 13-Jan-1946 Service: General Medicine Location: 452/01  Patient Class: Inpatient  DC Disposition: : Home Health Care  Discharge DC Disposition: : Home Health Care Homecare Referral: (PT) Physical Therapy, (OT) Occupational Therapy Homecare/Hospice Agency(s) chosen: [facility] notified of pt d/c. Discharge Referrals Chosen geographical local area/county list shared with patient/family: Yes Date chosen geographical local area/county list shared with patient/family: 06/21/23 Post-Acute Provider Form Completed: Yes Case closed, patient/family agree with disposition plan: Yes  Sharlet DELENA Don, RN

## 2023-11-04 IMAGING — DX DG ABDOMEN 2V
3 series · 3 of 3 positions shown · non-contrast
Comparison: CT abdomen and pelvis 11/24/2001 report only.

CLINICAL DATA: History of anemia, abdominal pain, ventral hernia.

EXAM:
ABDOMEN - 2 VIEW

[abdomen erect]
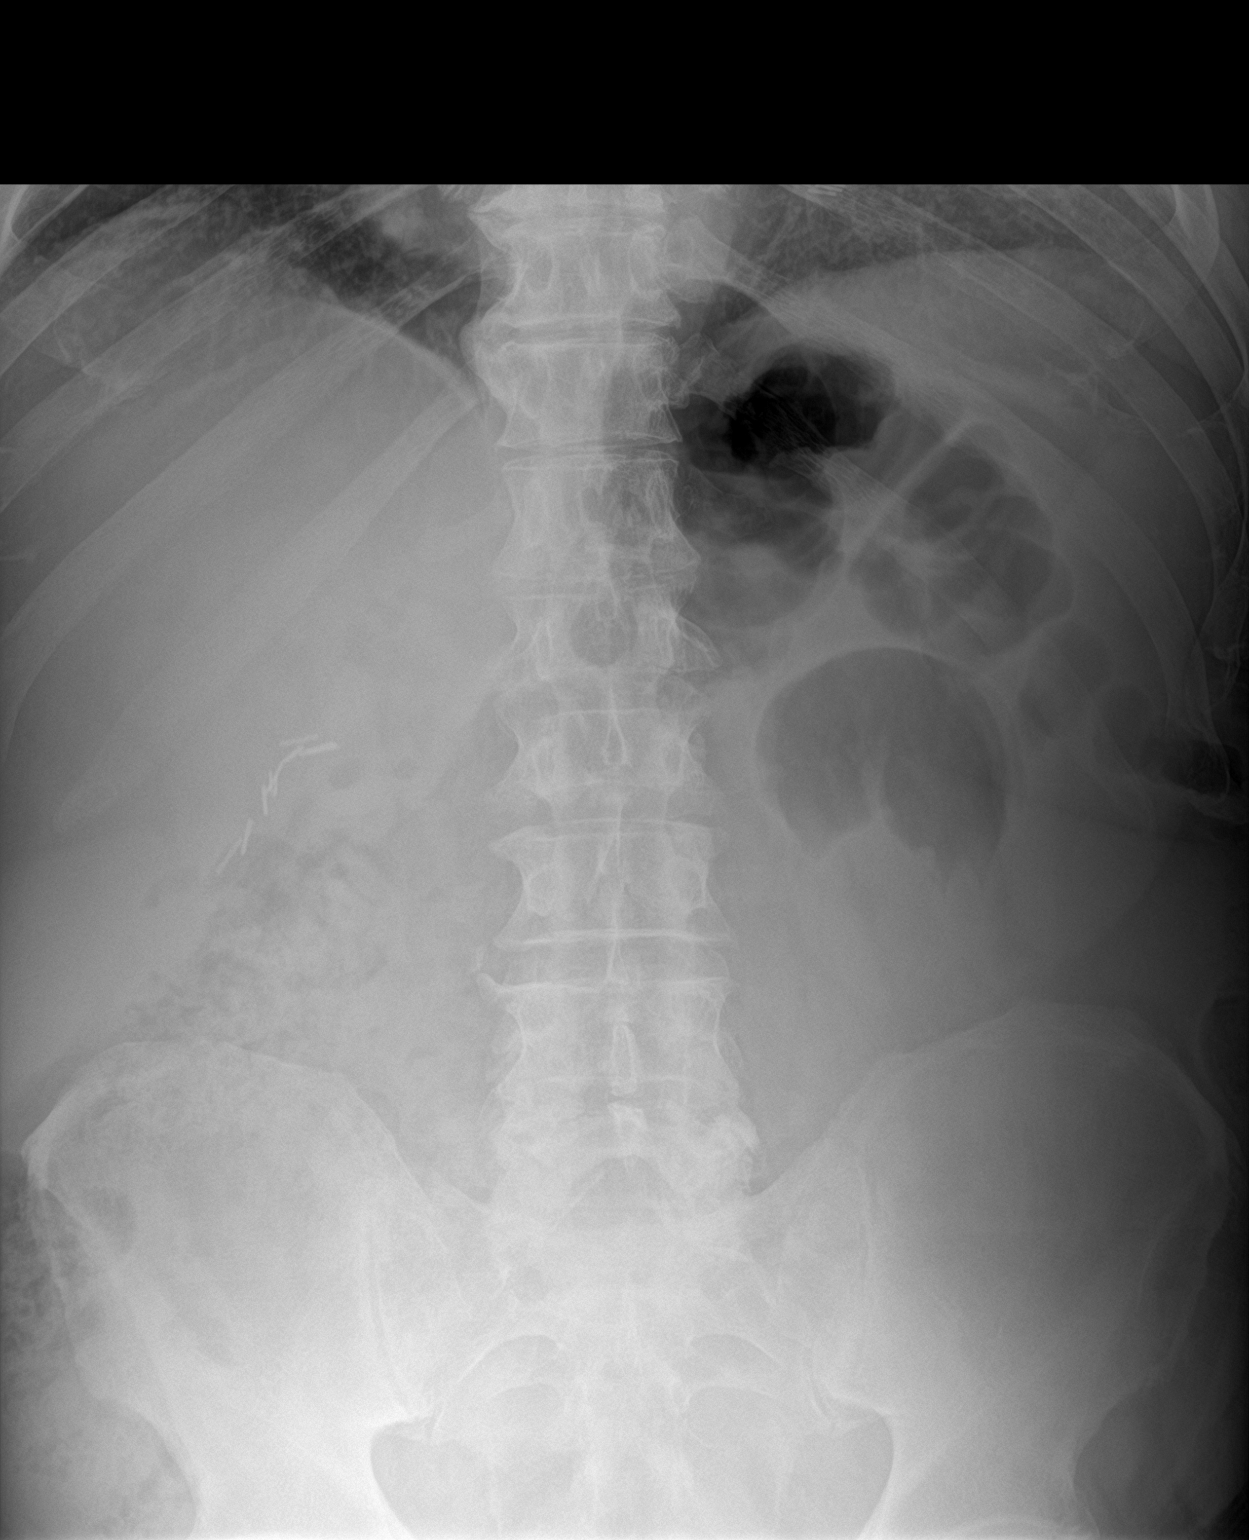

[abdomen supine (1 of 2)]
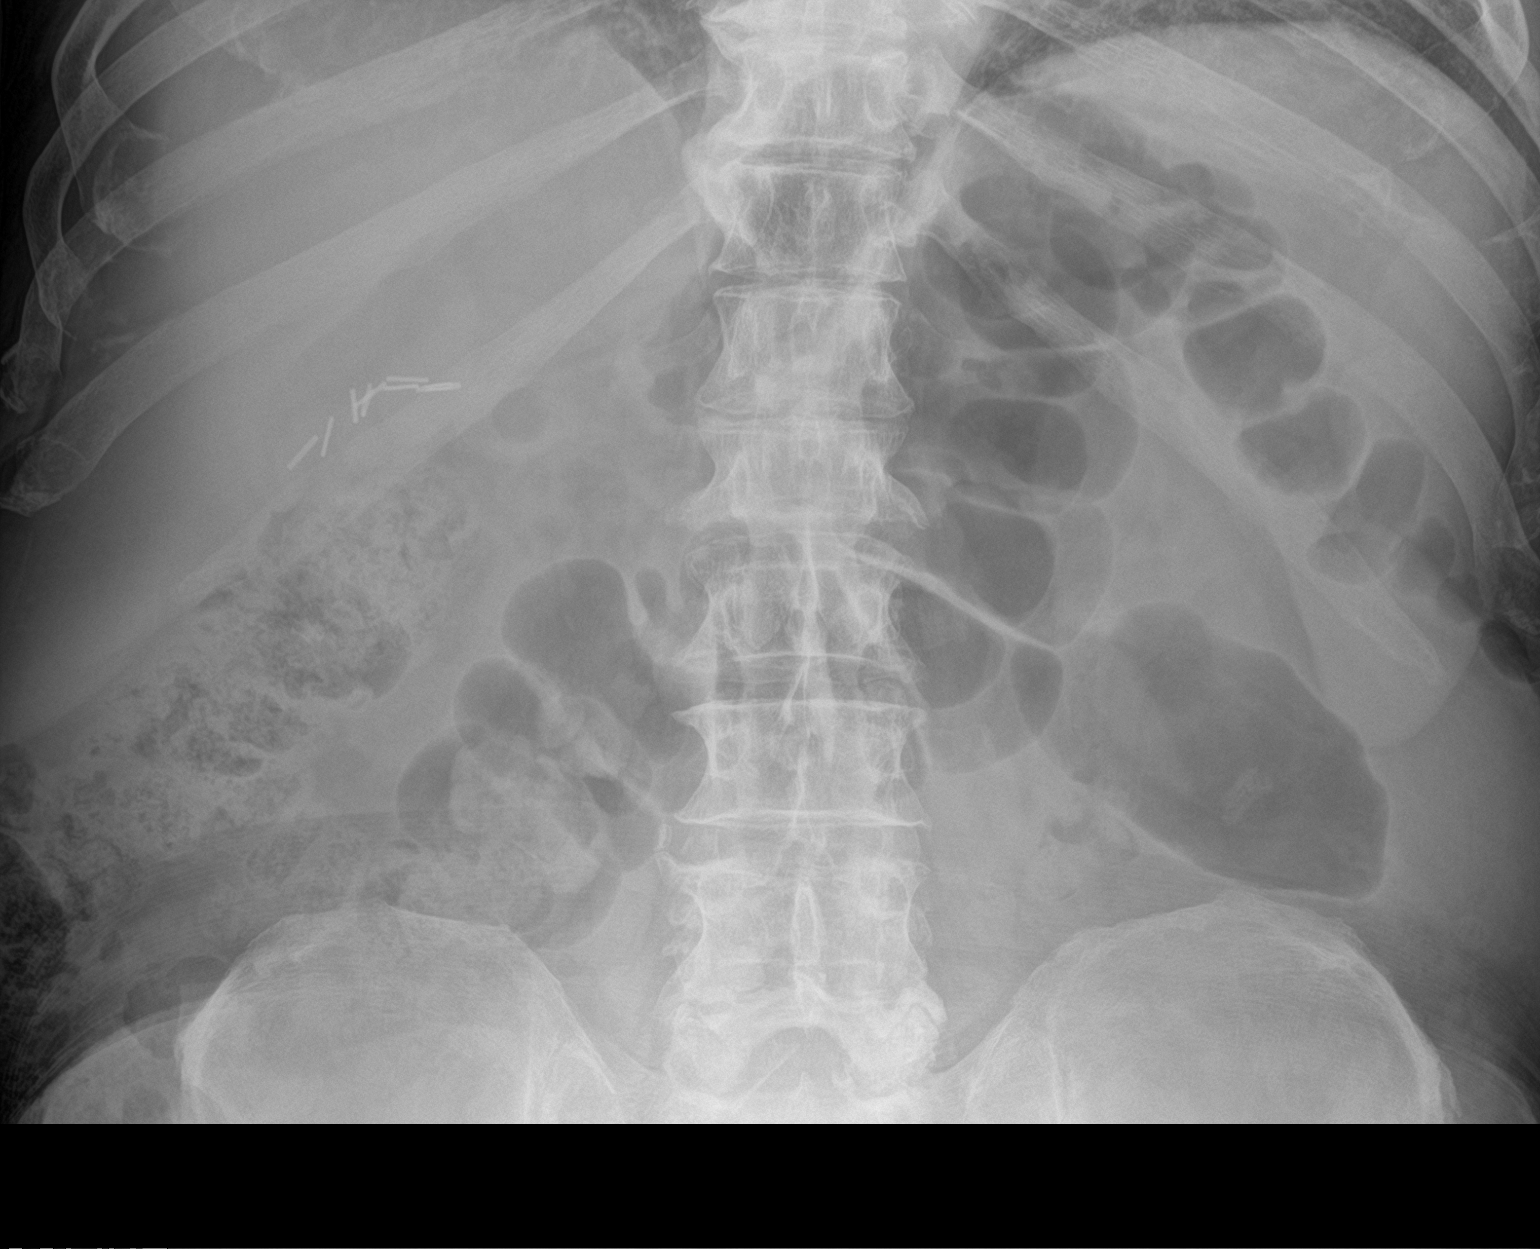

[abdomen supine (2 of 2)]
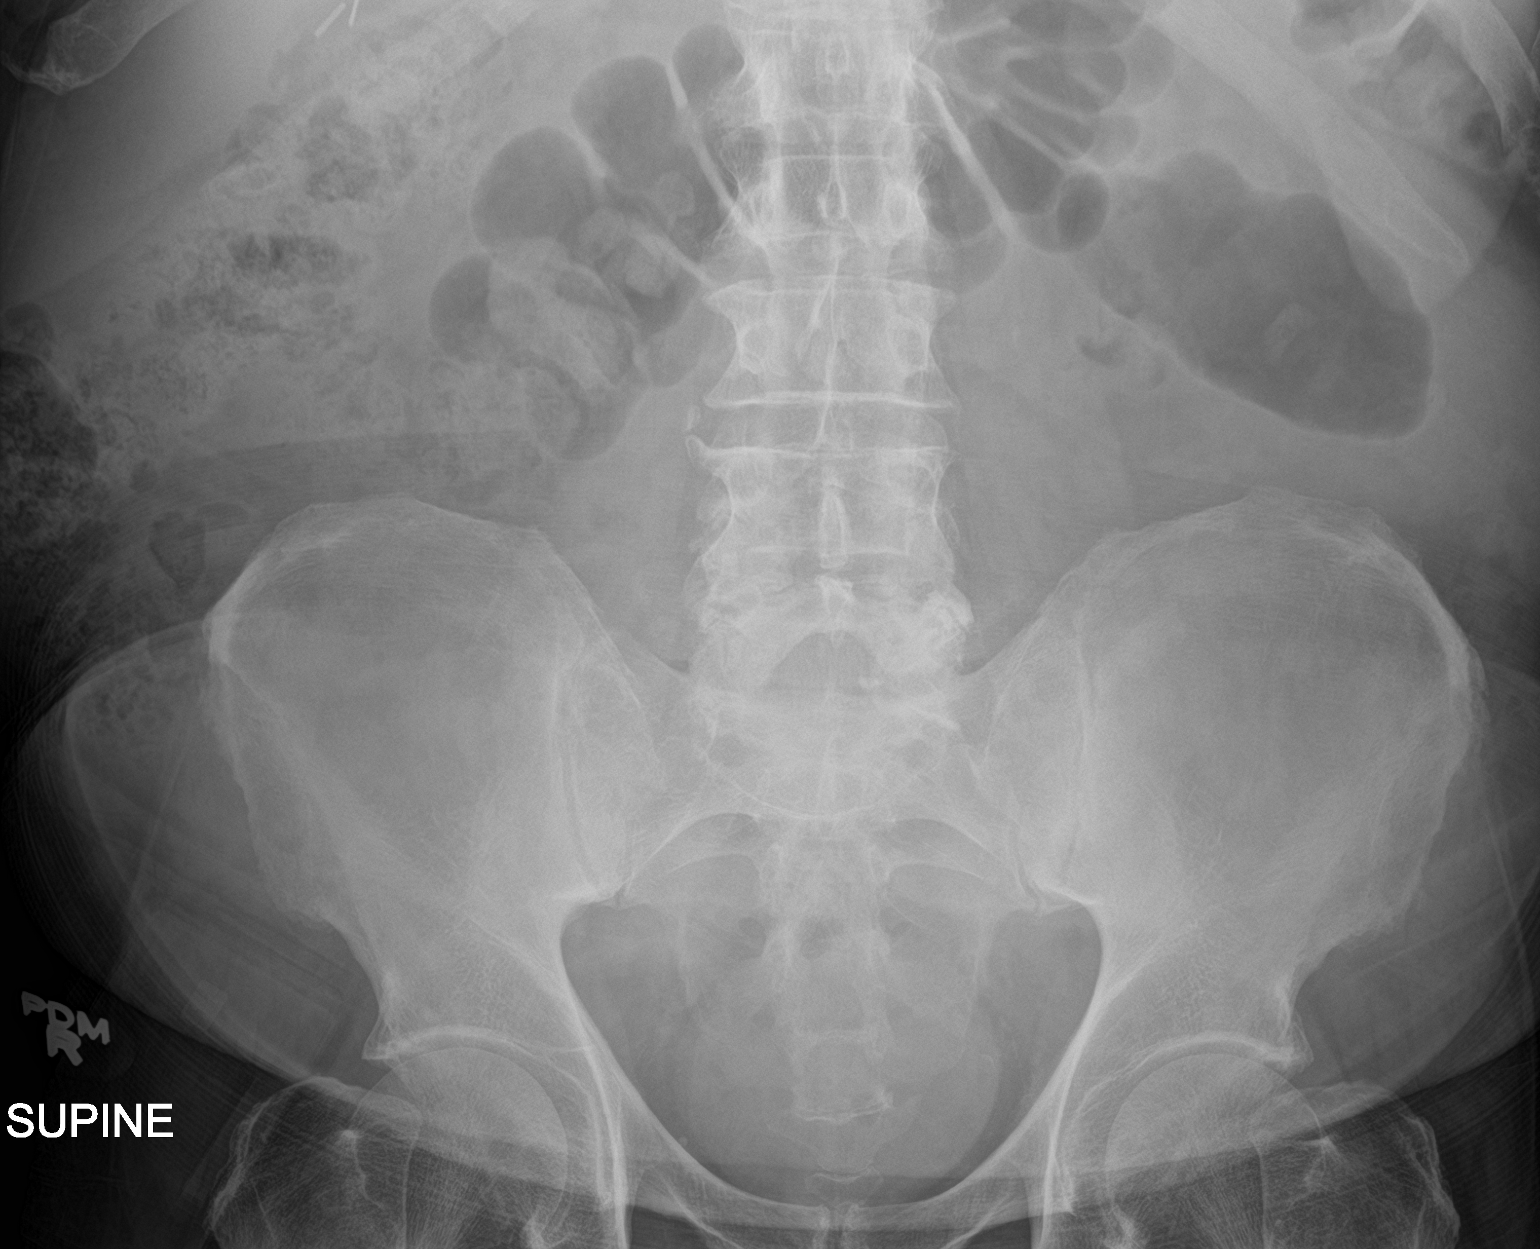

[3 of 3 positions shown; findings below may reference images not displayed]

FINDINGS: The transverse colon is mildly dilated measuring up to 6.8 cm. There
is a large amount of stool in the ascending colon. No dilated small
bowel loops. There are surgical clips in the right abdomen.
Sternotomy wires are present. There are no suspicious
calcifications. No acute fractures are seen. The visualized lung
bases are grossly clear.
IMPRESSION: 1. Nonobstructive bowel gas pattern.
2. There is gaseous distention of the transverse colon, nonspecific.
Colonic ileus not excluded.

## 2023-11-18 IMAGING — CT CT ABD-PELV W/ CM
2 of 5 series · 16 of 46 positions shown, 18 images · IV contrast (OMNIPAQUE)
Comparison: None.

CLINICAL DATA: Anemia, abdominal pain, abdominal hernia

EXAM:
CT ABDOMEN AND PELVIS WITH CONTRAST
TECHNIQUE: Multidetector CT imaging of the abdomen and pelvis was performed
using the standard protocol following bolus administration of
intravenous contrast.

[Series 2: axial st · axial · 0.96mm/px · z∈[-393,-13]mm · 13 of 88 slices shown, 15 images]
[im 6/88  soft-tissue]
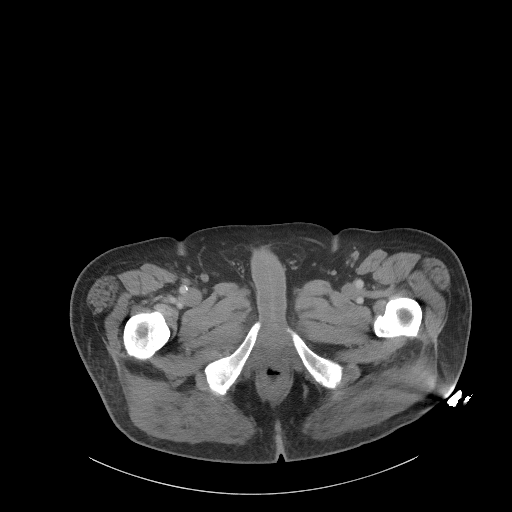
[im 6/88  bone]
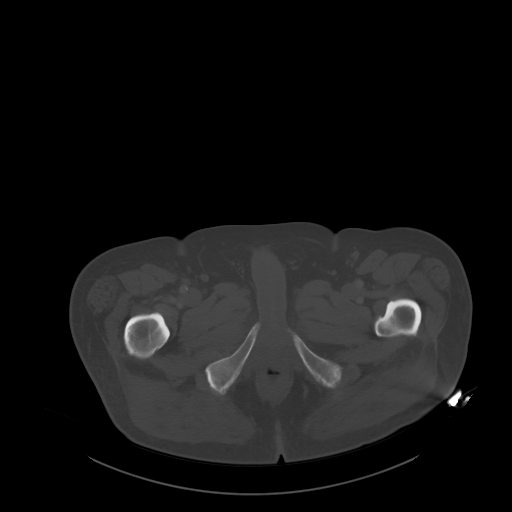
[im 11/88  soft-tissue]
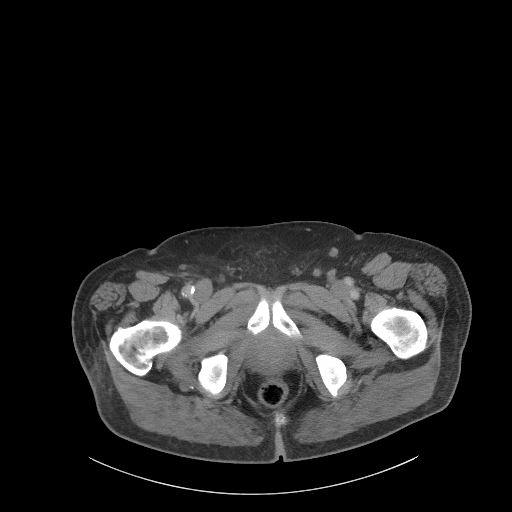
[im 17/88  soft-tissue]
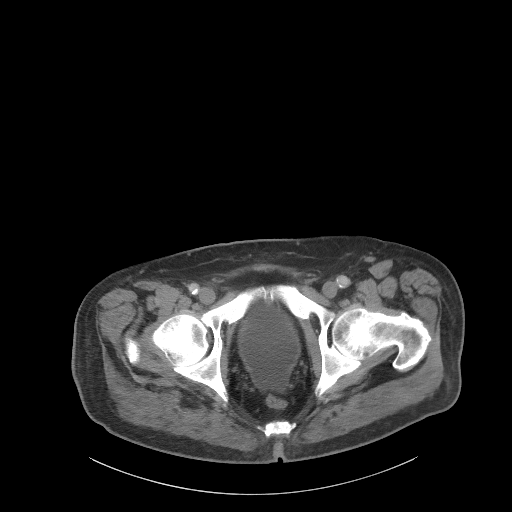
[im 28/88  soft-tissue]
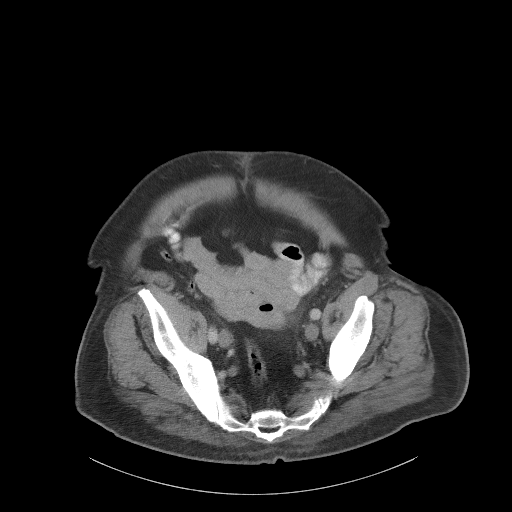
[im 33/88  soft-tissue]
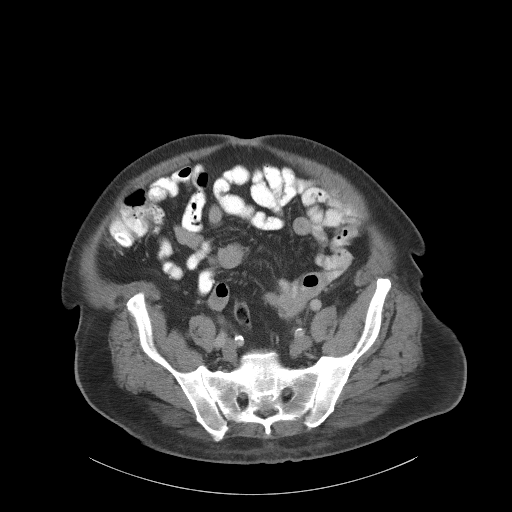
[im 39/88  soft-tissue]
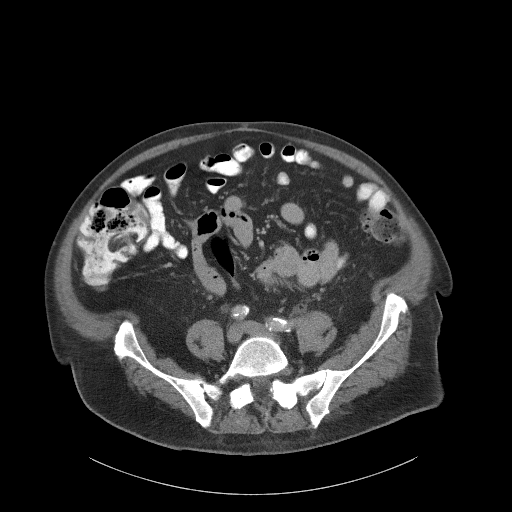
[im 44/88  soft-tissue]
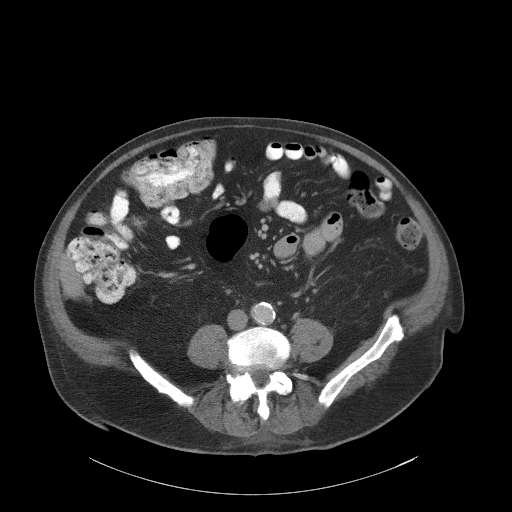
[im 49/88  soft-tissue]
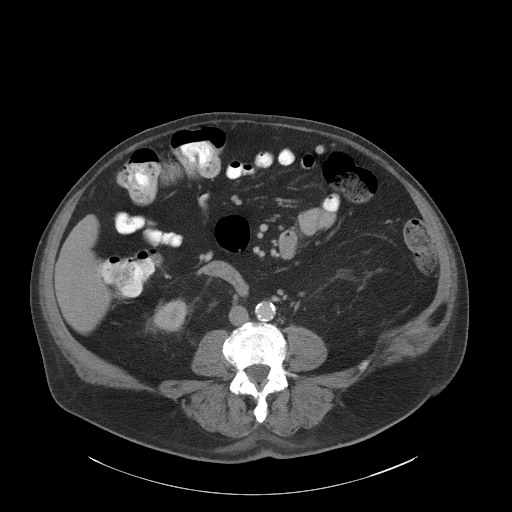
[im 55/88  soft-tissue]
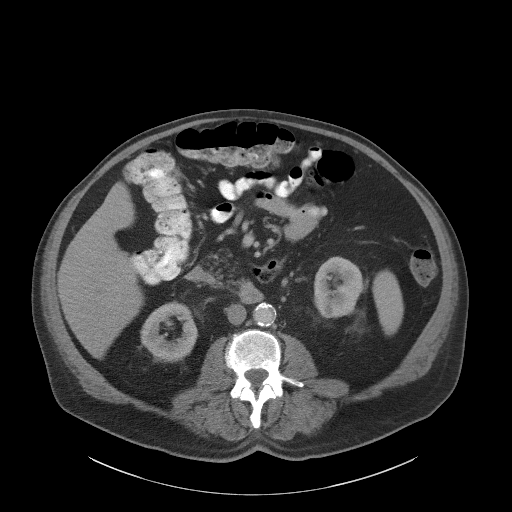
[im 55/88  bone]
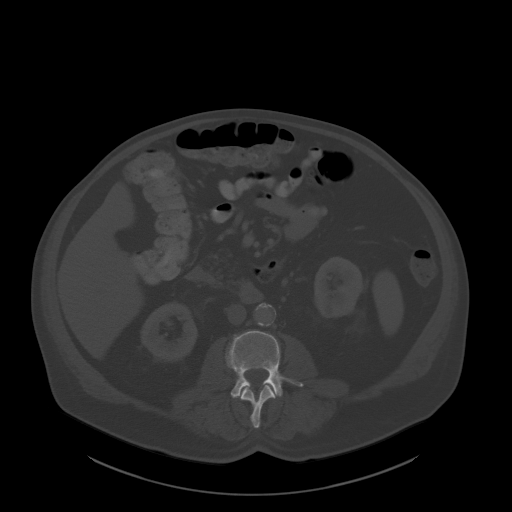
[im 60/88  soft-tissue]
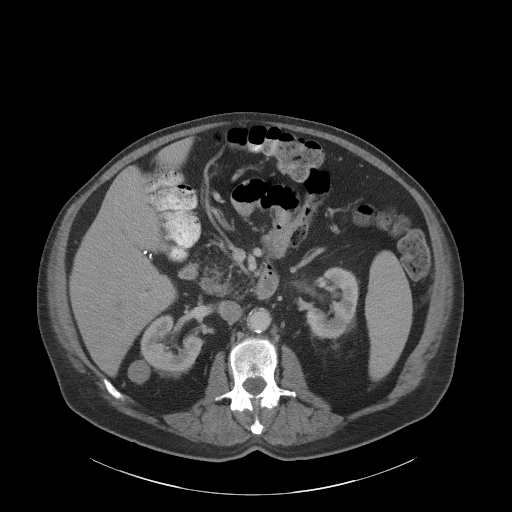
[im 71/88  soft-tissue]
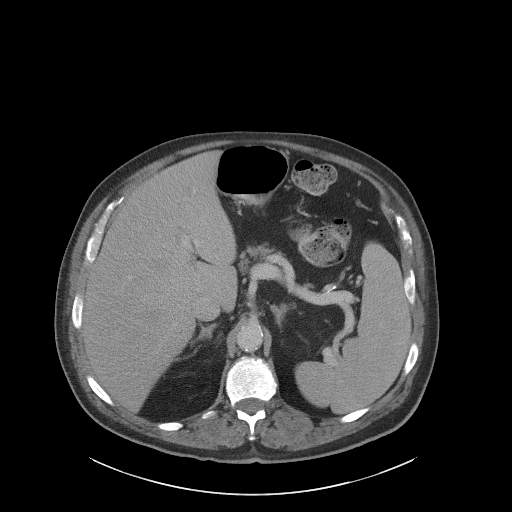
[im 77/88  soft-tissue]
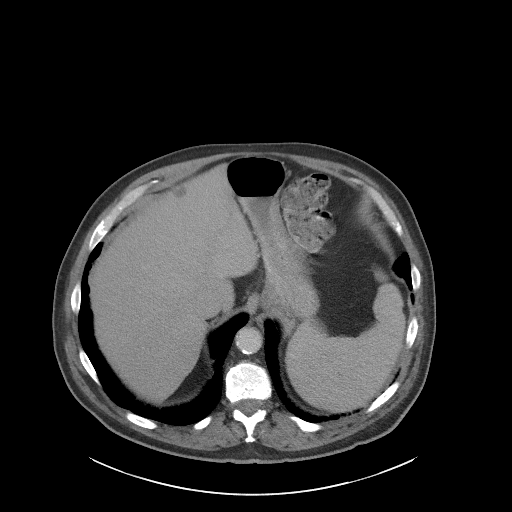
[im 82/88  soft-tissue]
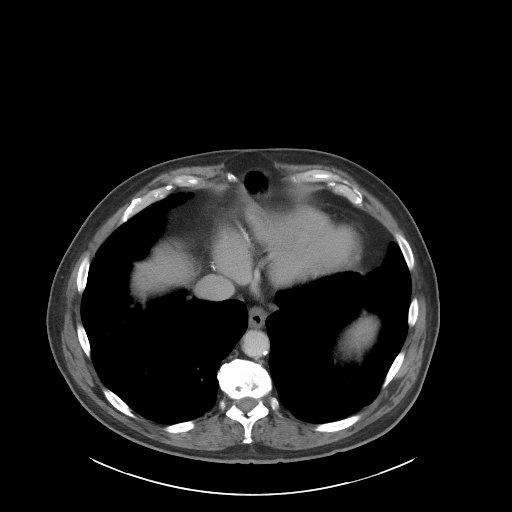

[Series 5: coronal st · coronal · 0.88mm/px · 3 of 107 slices shown]
[im 36/107  soft-tissue]
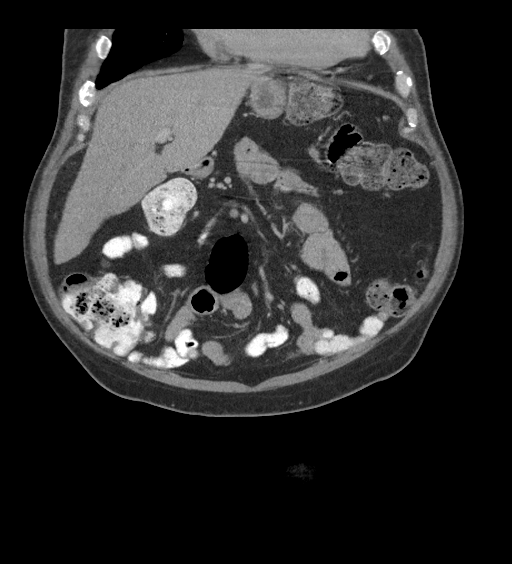
[im 48/107  soft-tissue]
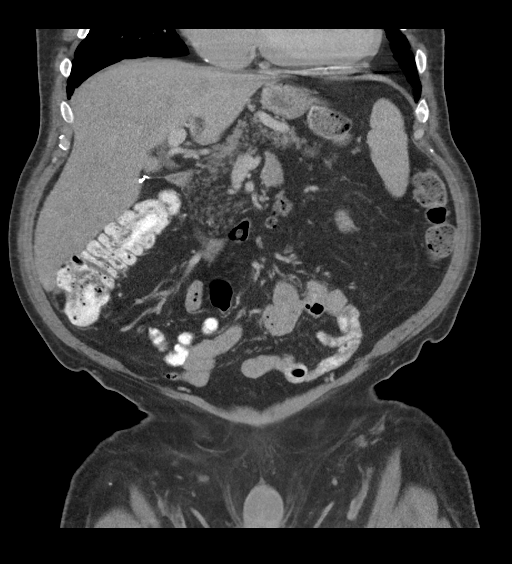
[im 59/107  soft-tissue]
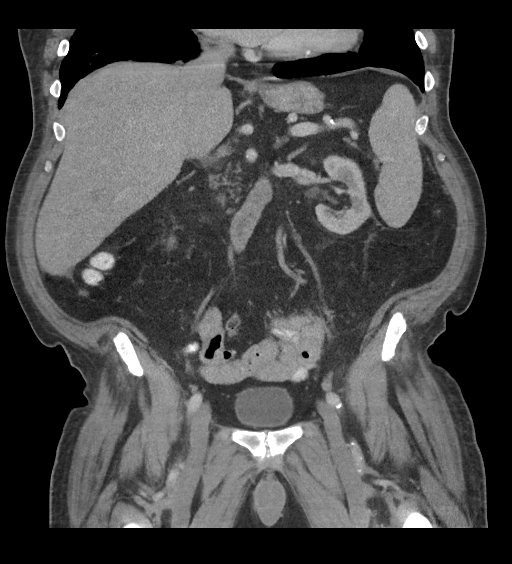

[16 of 46 positions shown; findings below may reference images not displayed]

RADIATION DOSE REDUCTION: This exam was performed according to the
departmental dose-optimization program which includes automated
exposure control, adjustment of the mA and/or kV according to
patient size and/or use of iterative reconstruction technique.

CONTRAST:  80mL OMNIPAQUE IOHEXOL 300 MG/ML  SOLN
FINDINGS: Lower chest: Pleural-based nodularity at the right lung base
overlying the hemidiaphragm measuring up to 6 mm in size.

Hepatobiliary: Liver is enlarged measuring 20.5 cm in length. 1.4 cm
hypodense likely cyst identified anteriorly in the liver.
Gallbladder is surgically absent. No biliary ductal dilatation
identified.

Pancreas: Fatty atrophy of the pancreas with no mass or ductal
dilatation identified.

Spleen: Enlarged measuring 17 cm in length.

Adrenals/Urinary Tract: Adrenal glands appear normal. 2 cm mildly
increased density exophytic cyst at the posterior aspect of the
right kidney and 1.1 cm cyst in the lower pole left kidney. No
hydronephrosis identified bilaterally. Urinary bladder appears
within normal limits.

Stomach/Bowel: No bowel obstruction, free air or pneumatosis. No
bowel wall edema or thickening identified. Appendix is normal.

Vascular/Lymphatic: Severe atherosclerotic disease. No bulky
lymphadenopathy identified.

Reproductive: Prostate gland within normal limits.

Other: No ascites. 3.5 cm midline upper abdominal wall hernia
containing fat.

Musculoskeletal: No suspicious bony lesions identified.
IMPRESSION: 1. Hepatosplenomegaly.
2. Upper abdominal ventral abdominal wall hernia containing fat.
3. Pleural-based nodularity of the right lung base, consider
follow-up CT chest in 6-12 months.
4. Other chronic findings as described.

## 2024-03-03 NOTE — Telephone Encounter (Signed)
-------------------------------------------------------------------------------   Summary: medication -------------------------------------------------------------------------------  Patients wife called and stated Dr. Zachary prescribed him Metolazone and is wanting to make sure it is okay to take. Please give a call back to [phone]. Thank you.

## 2024-03-04 ENCOUNTER — Emergency Department (HOSPITAL_COMMUNITY)

## 2024-03-04 ENCOUNTER — Inpatient Hospital Stay (HOSPITAL_COMMUNITY)
Admission: EM | Admit: 2024-03-04 | Discharge: 2024-03-10 | DRG: 291 | Disposition: A | Discharge status: Home / Self Care | Attending: Internal Medicine | Admitting: Internal Medicine

## 2024-03-04 ENCOUNTER — Other Ambulatory Visit: Payer: Self-pay

## 2024-03-04 DIAGNOSIS — Z8249 Family history of ischemic heart disease and other diseases of the circulatory system: Secondary | ICD-10-CM

## 2024-03-04 DIAGNOSIS — I272 Pulmonary hypertension, unspecified: Secondary | ICD-10-CM | POA: Diagnosis not present

## 2024-03-04 DIAGNOSIS — I5043 Acute on chronic combined systolic (congestive) and diastolic (congestive) heart failure: Secondary | ICD-10-CM | POA: Diagnosis present

## 2024-03-04 DIAGNOSIS — J9601 Acute respiratory failure with hypoxia: Secondary | ICD-10-CM | POA: Diagnosis present

## 2024-03-04 DIAGNOSIS — D6959 Other secondary thrombocytopenia: Secondary | ICD-10-CM | POA: Diagnosis present

## 2024-03-04 DIAGNOSIS — D631 Anemia in chronic kidney disease: Secondary | ICD-10-CM | POA: Diagnosis present

## 2024-03-04 DIAGNOSIS — Z86718 Personal history of other venous thrombosis and embolism: Secondary | ICD-10-CM

## 2024-03-04 DIAGNOSIS — I4892 Unspecified atrial flutter: Secondary | ICD-10-CM | POA: Diagnosis present

## 2024-03-04 DIAGNOSIS — E785 Hyperlipidemia, unspecified: Secondary | ICD-10-CM | POA: Diagnosis present

## 2024-03-04 DIAGNOSIS — Z8546 Personal history of malignant neoplasm of prostate: Secondary | ICD-10-CM

## 2024-03-04 DIAGNOSIS — I472 Ventricular tachycardia, unspecified: Secondary | ICD-10-CM | POA: Diagnosis not present

## 2024-03-04 DIAGNOSIS — I739 Peripheral vascular disease, unspecified: Secondary | ICD-10-CM | POA: Diagnosis not present

## 2024-03-04 DIAGNOSIS — Z7982 Long term (current) use of aspirin: Secondary | ICD-10-CM

## 2024-03-04 DIAGNOSIS — I130 Hypertensive heart and chronic kidney disease with heart failure and stage 1 through stage 4 chronic kidney disease, or unspecified chronic kidney disease: Principal | ICD-10-CM | POA: Diagnosis present

## 2024-03-04 DIAGNOSIS — N179 Acute kidney failure, unspecified: Secondary | ICD-10-CM | POA: Diagnosis not present

## 2024-03-04 DIAGNOSIS — I2510 Atherosclerotic heart disease of native coronary artery without angina pectoris: Secondary | ICD-10-CM | POA: Diagnosis present

## 2024-03-04 DIAGNOSIS — E1151 Type 2 diabetes mellitus with diabetic peripheral angiopathy without gangrene: Secondary | ICD-10-CM | POA: Diagnosis present

## 2024-03-04 DIAGNOSIS — E872 Acidosis, unspecified: Secondary | ICD-10-CM | POA: Diagnosis present

## 2024-03-04 DIAGNOSIS — E1165 Type 2 diabetes mellitus with hyperglycemia: Secondary | ICD-10-CM | POA: Diagnosis present

## 2024-03-04 DIAGNOSIS — Z7984 Long term (current) use of oral hypoglycemic drugs: Secondary | ICD-10-CM

## 2024-03-04 DIAGNOSIS — I25810 Atherosclerosis of coronary artery bypass graft(s) without angina pectoris: Secondary | ICD-10-CM | POA: Diagnosis present

## 2024-03-04 DIAGNOSIS — R040 Epistaxis: Secondary | ICD-10-CM | POA: Diagnosis not present

## 2024-03-04 DIAGNOSIS — Z79899 Other long term (current) drug therapy: Secondary | ICD-10-CM

## 2024-03-04 DIAGNOSIS — Z95810 Presence of automatic (implantable) cardiac defibrillator: Secondary | ICD-10-CM

## 2024-03-04 DIAGNOSIS — Z7901 Long term (current) use of anticoagulants: Secondary | ICD-10-CM

## 2024-03-04 DIAGNOSIS — I255 Ischemic cardiomyopathy: Secondary | ICD-10-CM | POA: Diagnosis present

## 2024-03-04 DIAGNOSIS — N184 Chronic kidney disease, stage 4 (severe): Secondary | ICD-10-CM | POA: Diagnosis present

## 2024-03-04 DIAGNOSIS — I5023 Acute on chronic systolic (congestive) heart failure: Secondary | ICD-10-CM | POA: Diagnosis not present

## 2024-03-04 DIAGNOSIS — I480 Paroxysmal atrial fibrillation: Secondary | ICD-10-CM | POA: Diagnosis not present

## 2024-03-04 DIAGNOSIS — Z86711 Personal history of pulmonary embolism: Secondary | ICD-10-CM

## 2024-03-04 DIAGNOSIS — Z862 Personal history of diseases of the blood and blood-forming organs and certain disorders involving the immune mechanism: Secondary | ICD-10-CM

## 2024-03-04 DIAGNOSIS — Z8673 Personal history of transient ischemic attack (TIA), and cerebral infarction without residual deficits: Secondary | ICD-10-CM

## 2024-03-04 DIAGNOSIS — Z833 Family history of diabetes mellitus: Secondary | ICD-10-CM

## 2024-03-04 DIAGNOSIS — I509 Heart failure, unspecified: Secondary | ICD-10-CM

## 2024-03-04 DIAGNOSIS — Z87891 Personal history of nicotine dependence: Secondary | ICD-10-CM

## 2024-03-04 DIAGNOSIS — M109 Gout, unspecified: Secondary | ICD-10-CM | POA: Diagnosis present

## 2024-03-04 DIAGNOSIS — I5082 Biventricular heart failure: Secondary | ICD-10-CM | POA: Diagnosis present

## 2024-03-04 DIAGNOSIS — Z951 Presence of aortocoronary bypass graft: Secondary | ICD-10-CM

## 2024-03-04 DIAGNOSIS — E1122 Type 2 diabetes mellitus with diabetic chronic kidney disease: Secondary | ICD-10-CM | POA: Diagnosis present

## 2024-03-04 DIAGNOSIS — I5020 Unspecified systolic (congestive) heart failure: Principal | ICD-10-CM

## 2024-03-04 DIAGNOSIS — I252 Old myocardial infarction: Secondary | ICD-10-CM

## 2024-03-04 DIAGNOSIS — J449 Chronic obstructive pulmonary disease, unspecified: Secondary | ICD-10-CM | POA: Diagnosis present

## 2024-03-04 LAB — CBC WITH DIFFERENTIAL/PLATELET
Abs Immature Granulocytes: 0.13 K/uL — ABNORMAL HIGH (ref 0.00–0.07)
Basophils Absolute: 0 K/uL (ref 0.0–0.1)
Basophils Relative: 0 %
Eosinophils Absolute: 0.1 K/uL (ref 0.0–0.5)
Eosinophils Relative: 1 %
HCT: 34.1 % — ABNORMAL LOW (ref 39.0–52.0)
Hemoglobin: 10.4 g/dL — ABNORMAL LOW (ref 13.0–17.0)
Immature Granulocytes: 2 %
Lymphocytes Relative: 11 %
Lymphs Abs: 0.8 K/uL (ref 0.7–4.0)
MCH: 32.6 pg (ref 26.0–34.0)
MCHC: 30.5 g/dL (ref 30.0–36.0)
MCV: 106.9 fL — ABNORMAL HIGH (ref 80.0–100.0)
Monocytes Absolute: 0.5 K/uL (ref 0.1–1.0)
Monocytes Relative: 7 %
Neutro Abs: 5.8 K/uL (ref 1.7–7.7)
Neutrophils Relative %: 79 %
Platelets: 130 K/uL — ABNORMAL LOW (ref 150–400)
RBC: 3.19 MIL/uL — ABNORMAL LOW (ref 4.22–5.81)
RDW: 17.8 % — ABNORMAL HIGH (ref 11.5–15.5)
WBC: 7.3 K/uL (ref 4.0–10.5)
nRBC: 0 % (ref 0.0–0.2)

## 2024-03-04 LAB — COMPREHENSIVE METABOLIC PANEL WITH GFR
ALT: 27 U/L (ref 0–44)
AST: 23 U/L (ref 15–41)
Albumin: 3.9 g/dL (ref 3.5–5.0)
Alkaline Phosphatase: 175 U/L — ABNORMAL HIGH (ref 38–126)
Anion gap: 12 (ref 5–15)
BUN: 70 mg/dL — ABNORMAL HIGH (ref 8–23)
CO2: 22 mmol/L (ref 22–32)
Calcium: 8.7 mg/dL — ABNORMAL LOW (ref 8.9–10.3)
Chloride: 104 mmol/L (ref 98–111)
Creatinine, Ser: 3.25 mg/dL — ABNORMAL HIGH (ref 0.61–1.24)
GFR, Estimated: 19 mL/min — ABNORMAL LOW (ref >60)
Glucose, Bld: 253 mg/dL — ABNORMAL HIGH (ref 70–99)
Potassium: 4.1 mmol/L (ref 3.5–5.1)
Sodium: 139 mmol/L (ref 135–145)
Total Bilirubin: 0.7 mg/dL (ref 0.0–1.2)
Total Protein: 6.2 g/dL — ABNORMAL LOW (ref 6.5–8.1)

## 2024-03-04 LAB — GLUCOSE, CAPILLARY: Glucose-Capillary: 220 mg/dL — ABNORMAL HIGH (ref 70–99)

## 2024-03-04 LAB — PRO BRAIN NATRIURETIC PEPTIDE: Pro Brain Natriuretic Peptide: 19477 pg/mL — ABNORMAL HIGH (ref <300.0)

## 2024-03-04 MED ORDER — SODIUM CHLORIDE 0.9% FLUSH
3.0000 mL | Freq: Two times a day (BID) | INTRAVENOUS | Status: DC
  Administered 2024-03-09: 3 mL via INTRAVENOUS

## 2024-03-04 MED ORDER — FUROSEMIDE 10 MG/ML IJ SOLN
40.0000 mg | Freq: Two times a day (BID) | INTRAMUSCULAR | Status: DC
Start: 2024-03-04 — End: 2024-03-05
  Administered 2024-03-04 – 2024-03-05 (×2): 40 mg via INTRAVENOUS
  Filled 2024-03-04 (×2): qty 4

## 2024-03-04 MED ORDER — FUROSEMIDE 10 MG/ML IJ SOLN
40.0000 mg | Freq: Once | INTRAMUSCULAR | Status: AC
  Administered 2024-03-04: 40 mg via INTRAVENOUS
  Filled 2024-03-04: qty 4

## 2024-03-04 MED ORDER — TRAZODONE HCL 50 MG PO TABS
50.0000 mg | ORAL_TABLET | Freq: Every evening | ORAL | Status: DC | PRN
  Administered 2024-03-04 – 2024-03-08 (×4): 50 mg via ORAL
  Filled 2024-03-04 (×4): qty 1

## 2024-03-04 MED ORDER — SODIUM CHLORIDE 0.9 % IV SOLN: INTRAVENOUS | Status: AC | PRN

## 2024-03-04 MED ORDER — CARVEDILOL 12.5 MG PO TABS
12.5000 mg | ORAL_TABLET | Freq: Two times a day (BID) | ORAL | Status: DC
Start: 2024-03-04 — End: 2024-03-06
  Administered 2024-03-05 – 2024-03-06 (×3): 12.5 mg via ORAL
  Filled 2024-03-04 (×4): qty 1

## 2024-03-04 MED ORDER — BISACODYL 10 MG RE SUPP: 10.0000 mg | Freq: Every day | RECTAL | Status: DC | PRN

## 2024-03-04 MED ORDER — HEPARIN SODIUM (PORCINE) 5000 UNIT/ML IJ SOLN: 5000.0000 [IU] | Freq: Three times a day (TID) | INTRAMUSCULAR | Status: DC

## 2024-03-04 MED ORDER — ASPIRIN 81 MG PO TBEC
81.0000 mg | DELAYED_RELEASE_TABLET | Freq: Every day | ORAL | Status: DC
  Administered 2024-03-04: 81 mg via ORAL
  Filled 2024-03-04 (×2): qty 1

## 2024-03-04 MED ORDER — ACETAMINOPHEN 650 MG RE SUPP: 650.0000 mg | Freq: Four times a day (QID) | RECTAL | Status: DC | PRN

## 2024-03-04 MED ORDER — INSULIN ASPART 100 UNIT/ML IJ SOLN
0.0000 [IU] | Freq: Every day | INTRAMUSCULAR | Status: DC
  Administered 2024-03-04: 2 [IU] via SUBCUTANEOUS

## 2024-03-04 MED ORDER — SODIUM CHLORIDE 0.9% FLUSH
3.0000 mL | Freq: Two times a day (BID) | INTRAVENOUS | Status: DC
  Administered 2024-03-04 – 2024-03-07 (×4): 3 mL via INTRAVENOUS

## 2024-03-04 MED ORDER — SPIRONOLACTONE 25 MG PO TABS
25.0000 mg | ORAL_TABLET | Freq: Every day | ORAL | Status: DC
  Administered 2024-03-05 – 2024-03-10 (×6): 25 mg via ORAL
  Filled 2024-03-04 (×6): qty 1

## 2024-03-04 MED ORDER — POLYETHYLENE GLYCOL 3350 17 G PO PACK
17.0000 g | PACK | Freq: Every day | ORAL | Status: DC | PRN
  Administered 2024-03-04 – 2024-03-05 (×2): 17 g via ORAL
  Filled 2024-03-04 (×2): qty 1

## 2024-03-04 MED ORDER — VITAMIN B-12 100 MCG PO TABS
100.0000 ug | ORAL_TABLET | Freq: Every day | ORAL | Status: DC
Start: 2024-03-05 — End: 2024-03-10
  Administered 2024-03-05 – 2024-03-10 (×6): 100 ug via ORAL
  Filled 2024-03-04 (×6): qty 1

## 2024-03-04 MED ORDER — SODIUM BICARBONATE 650 MG PO TABS
650.0000 mg | ORAL_TABLET | Freq: Every day | ORAL | Status: DC
  Administered 2024-03-04 – 2024-03-10 (×7): 650 mg via ORAL
  Filled 2024-03-04 (×7): qty 1

## 2024-03-04 MED ORDER — ONDANSETRON HCL 4 MG/2ML IJ SOLN: 4.0000 mg | Freq: Four times a day (QID) | INTRAMUSCULAR | Status: DC | PRN

## 2024-03-04 MED ORDER — RIVAROXABAN 15 MG PO TABS
15.0000 mg | ORAL_TABLET | Freq: Every day | ORAL | Status: DC
  Administered 2024-03-04 – 2024-03-09 (×6): 15 mg via ORAL
  Filled 2024-03-04 (×6): qty 1

## 2024-03-04 MED ORDER — INSULIN ASPART 100 UNIT/ML IJ SOLN
0.0000 [IU] | Freq: Three times a day (TID) | INTRAMUSCULAR | Status: DC
  Administered 2024-03-05: 1 [IU] via SUBCUTANEOUS
  Administered 2024-03-05: 3 [IU] via SUBCUTANEOUS
  Administered 2024-03-06 – 2024-03-07 (×5): 1 [IU] via SUBCUTANEOUS
  Administered 2024-03-08 – 2024-03-09 (×2): 2 [IU] via SUBCUTANEOUS
  Administered 2024-03-09: 3 [IU] via SUBCUTANEOUS

## 2024-03-04 MED ORDER — SODIUM CHLORIDE 0.9% FLUSH
3.0000 mL | INTRAVENOUS | Status: DC | PRN
  Administered 2024-03-04: 3 mL via INTRAVENOUS

## 2024-03-04 MED ORDER — ACETAMINOPHEN 325 MG PO TABS: 650.0000 mg | ORAL_TABLET | Freq: Four times a day (QID) | ORAL | Status: DC | PRN

## 2024-03-04 MED ORDER — ONDANSETRON HCL 4 MG PO TABS: 4.0000 mg | ORAL_TABLET | Freq: Four times a day (QID) | ORAL | Status: DC | PRN

## 2024-03-04 NOTE — ED Triage Notes (Signed)
 Pt from home complains of SOB and wt gain of 17 pounds in a week. Pt endorses dyspnea on exertion. Pt reports hx of stroke and heart attack. Pt sating 91% on RA Placed on 2L in room for 97%. Pt has pacemaker and Defib.

## 2024-03-04 NOTE — ED Provider Notes (Signed)
 [place] [facility] AT [facility] Provider Note   CSN: [id number] Arrival date & time: 03/04/24  9057     Patient presents with: Shortness of Breath and Bloated   Tom Bartlett is a 78 y.o. male.  {Add pertinent medical, surgical, social history, OB history to YEP:67052} Patient complains of increasing shortness of breath.  He has a history of congestive heart failure.  His wife states that he has a 17 pound weight loss.  Patient complains of increased swelling in his legs   Shortness of Breath      Prior to Admission medications   Medication Sig Start Date End Date Taking? Authorizing Provider  acetaminophen (TYLENOL) 325 MG tablet Take 2 tablets by mouth as needed.    [provider]  AMBULATORY NON FORMULARY MEDICATION Abdominal Binder - use as directed 05/21/21   Mansouraty, Gabriel Jr., MD  amLODipine (NORVASC) 2.5 MG tablet Take 2.5 mg by mouth at bedtime.    [provider]  ASPIRIN 81 PO Take by mouth daily.    [provider]  Calcium Polycarbophil (FIBER) 625 MG TABS Take by mouth.    [provider]  carvedilol (COREG) 25 MG tablet Take 12.5 mg by mouth 2 (two) times daily. Patient not taking: Reported on 07/01/2022 06/27/20   [provider]  empagliflozin (JARDIANCE) 10 MG TABS tablet 1 tablet    [provider]  Iron-FA-B Cmp-C-Biot-Probiotic (FUSION PLUS) CAPS Take 1 capsule by mouth daily. 04/02/21   [provider]  metoprolol tartrate (LOPRESSOR) 50 MG tablet Take 50 mg by mouth 2 (two) times daily.    [provider]  Multiple Vitamins-Minerals (EYE HEALTH) CAPS Take 1 capsule by mouth in the morning and at bedtime.    [provider]  Niacin (VITAMIN B-3 PO) Take 2 tablets by mouth daily.    [provider]  Omega-3 Fatty Acids (FISH OIL) 1200 MG CAPS Take 1 capsule by mouth in the morning and at bedtime.    [provider]   sacubitril-valsartan (ENTRESTO) 49-51 MG daily. 09/27/20   [provider]  sodium bicarbonate 650 MG tablet Take 650 mg by mouth daily.    [provider]  spironolactone (ALDACTONE) 25 MG tablet Take 25 mg by mouth daily.    [provider]  torsemide (DEMADEX) 20 MG tablet Take 20 mg by mouth every other day. 06/04/20   [provider]  vitamin B-12 (CYANOCOBALAMIN) 100 MCG tablet Take 100 mcg by mouth daily.    [provider]  XARELTO 2.5 MG TABS tablet Take 15 mg by mouth daily. 08/13/20   [provider]    Allergies: Statins    Review of Systems  Respiratory:  Positive for shortness of breath.     Updated Vital Signs BP 121/66   Pulse 68   Temp 97.8 F (36.6 C)   Resp (!) 24   SpO2 100%   Physical Exam  (all labs ordered are listed, but only abnormal results are displayed) Labs Reviewed  CBC WITH DIFFERENTIAL/PLATELET - Abnormal; Notable for the following components:      Result Value   RBC 3.19 (*)    Hemoglobin 10.4 (*)    HCT 34.1 (*)    MCV 106.9 (*)    RDW 17.8 (*)    Platelets 130 (*)    Abs Immature Granulocytes 0.13 (*)    All other components within normal limits  COMPREHENSIVE METABOLIC PANEL WITH GFR - Abnormal;  Notable for the following components:   Glucose, Bld 253 (*)    BUN 70 (*)    Creatinine, Ser 3.25 (*)    Calcium 8.7 (*)    Total Protein 6.2 (*)    Alkaline Phosphatase 175 (*)    GFR, Estimated 19 (*)    All other components within normal limits  PRO BRAIN NATRIURETIC PEPTIDE - Abnormal; Notable for the following components:   Pro Brain Natriuretic Peptide 19,477.0 (*)    All other components within normal limits    EKG: None  Radiology: [facility] Chest Port 1 View Result Date: 03/04/2024 CLINICAL DATA:  Shortness of breath and 17 pound weight gain over 1 week. EXAM: PORTABLE CHEST 1 VIEW COMPARISON:  06/22/2023 FINDINGS: Sternotomy wires unchanged. Left-sided pacemaker unchanged. Lungs  are adequately inflated with minimal blunting of the left costophrenic angle which is new. This likely represents a small amount left pleural fluid and associated atelectasis. Mild stable cardiomegaly. Remainder of the exam is unchanged. IMPRESSION: 1. New minimal blunting of the left costophrenic angle likely small amount of left pleural fluid and associated atelectasis. 2. Stable cardiomegaly. Electronically Signed   By: Toribio Agreste M.D.   On: 03/04/2024 10:19    {Document cardiac monitor, telemetry assessment procedure when appropriate:32947} Procedures   Medications Ordered in the ED  furosemide (LASIX) injection 40 mg (40 mg Intravenous Given 03/04/24 1151)      {Click here for ABCD2, HEART and other calculators REFRESH Note before signing:1}                              Medical Decision Making Amount and/or Complexity of Data Reviewed Labs: ordered. Radiology: ordered.  Risk Prescription drug management. Decision regarding hospitalization.   Patient with worsening congestive heart failure.  He will be admitted to medicine and diuresed  {Document critical care time when appropriate  Document review of labs and clinical decision tools ie CHADS2VASC2, etc  Document your independent review of radiology images and any outside records  Document your discussion with family members, caretakers and with consultants  Document social determinants of health affecting pt's care  Document your decision making why or why not admission, treatments were needed:32947:::1}   Final diagnoses:  Systolic congestive heart failure, unspecified HF chronicity [facility])    ED Discharge Orders     None

## 2024-03-04 NOTE — H&P (Signed)
 History and Physical    Patient: Tom Bartlett FMW:[id number] DOB: Mar 29, 1946 DOA: 03/04/2024 DOS: the patient was seen and examined on 03/04/2024 PCP: Gerome Tillman CROME, FNP  Patient coming from: Home  Chief Complaint:  Chief Complaint  Patient presents with   Shortness of Breath   Bloated   HPI: Tom Bartlett is a 78 y.o. male with medical history significant for CKD 4, history of systolic dysfunction CHF (echo from 06/15/2023 with EF of 20 to 25%), moderate pulmonary hypertension noted on RHC on 12/10/2022 and again on echo from 06/15/2023, history of prior left lower extremity DVT and PE, PAD, PAFib with history of prior stroke in 2012 and 2019,-on chronic anticoagulation with Xarelto, AICD in situ, COPD, CAD with prior CABG in 2017 presents to the ED with complaints of dyspnea at rest, dyspnea on exertion, 17 to 20 pound weight gain over the last couple of weeks, orthopnea and worsening lower extremity edema. -No fever  Or chills  -No Nausea, Vomiting or Diarrhea -- Additional history obtained from patient's wife and patient's 2 daughters at bedside - No falls, no chest pains, no palpitations, no dizziness -In the ED O2 sats is in the 80s on room air patient placed on 2 to 3 L of oxygen In ED chest x-ray with small left-sided pleural effusion and cardiomegaly Creatinine is 3.25 review of Care Everywhere shows creatinine was 3.01 on 01/25/2024 and 3.3 on 12/16/2022 -LFTs are not elevated except for alk phos of 175 -Glucose is elevated at 253 -proBNP is 19,477 - WBC 7.3 hemoglobin 10.4 and platelets 130   Review of Systems: As mentioned in the history of present illness. All other systems reviewed and are negative. Past Medical History:  Diagnosis Date   AICD (automatic cardioverter/defibrillator) present    Anemia    CAD (coronary artery disease)    Chronic systolic heart failure (HCC)    Chronic venous insufficiency    CKD (chronic kidney disease)    Stage 3   COPD (chronic  obstructive pulmonary disease) (HCC)    Diabetes (HCC)    DVT (deep venous thrombosis) (HCC) 1979   Left Leg   Dyslipidemia    Hypertension    Kidney stones    Myocardial infarction (HCC) 05/18/2005   Obstructive sleep apnea    PAF (paroxysmal atrial fibrillation) (HCC)    Peripheral artery disease    Prostate cancer (HCC)    Pulmonary embolism (HCC) 2003   Sick sinus syndrome (HCC)    Stroke (HCC)    2012, 2019  Some right arm numbness   Past Surgical History:  Procedure Laterality Date   CARDIAC CATHETERIZATION     2007,  2023   CATARACT EXTRACTION W/PHACO Right 07/06/2022   Procedure: CATARACT EXTRACTION PHACO AND INTRAOCULAR LENS PLACEMENT (IOC) RIGHT DIABETIC  2.74  00:24.0;  Surgeon: Myrna Adine Anes, MD;  Location: [facility];  Service: Ophthalmology;  Laterality: Right;  Diabetic   CHOLECYSTECTOMY     COLONOSCOPY     CORONARY ARTERY BYPASS GRAFT  08/09/2015   4 vessel  [facility]   FINGER SURGERY Right    ICD IMPLANT  02/2022   KNEE SURGERY     R knee x 2, L knee x 1   ROTATOR CUFF REPAIR Left    UMBILICAL HERNIA REPAIR     Social History:  reports that he has quit smoking. His smoking use included cigarettes. He has never used smokeless tobacco. He reports that he does not currently use alcohol.  He reports that he does not use drugs.  Allergies  Allergen Reactions   Statins Other (See Comments)    Muscle aches    Family History  Problem Relation Age of Onset   Dementia Mother    Diabetes Father    Heart disease Father    Congestive Heart Failure Father    Heart disease Sister    Heart disease Brother    Colon cancer Neg Hx    Stomach cancer Neg Hx    Pancreatic cancer Neg Hx    Esophageal cancer Neg Hx    Inflammatory bowel disease Neg Hx    Liver disease Neg Hx    Rectal cancer Neg Hx     Prior to Admission medications   Medication Sig Start Date End Date Taking? Authorizing Provider  acetaminophen (TYLENOL) 325 MG tablet Take 2 tablets by  mouth every 6 (six) hours as needed for mild pain (pain score 1-3). Patient not taking: Reported on 03/04/2024    [provider]  AMBULATORY NON FORMULARY MEDICATION Abdominal Binder - use as directed 05/21/21   Mansouraty, Aloha Raddle., MD  amiodarone (PACERONE) 200 MG tablet Take 200 mg by mouth 2 (two) times daily. 06/23/23   [provider]  amLODipine (NORVASC) 2.5 MG tablet Take 2.5 mg by mouth at bedtime.    [provider]  ASPIRIN 81 PO Take 1 tablet by mouth daily.    [provider]  Calcium Polycarbophil (FIBER) 625 MG TABS Take 1 tablet by mouth daily.    [provider]  ciprofloxacin (CIPRO) 500 MG tablet Take 500 mg by mouth 2 (two) times daily. 02/14/24   [provider]  empagliflozin (JARDIANCE) 10 MG TABS tablet Take 10 mg by mouth daily.    [provider]  Iron-FA-B Cmp-C-Biot-Probiotic (FUSION PLUS) CAPS Take 1 capsule by mouth daily. 04/02/21   [provider]  isosorbide mononitrate (IMDUR) 30 MG 24 hr tablet Take 30 mg by mouth daily. 12/11/22   [provider]  metolazone (ZAROXOLYN) 10 MG tablet Take 10 mg by mouth daily. 03/01/24 03/11/24  [provider]  metoprolol succinate (TOPROL-XL) 50 MG 24 hr tablet Take 50 mg by mouth 2 (two) times daily.    [provider]  Multiple Vitamins-Minerals (EYE HEALTH) CAPS Take 1 capsule by mouth in the morning and at bedtime.    [provider]  Niacin (VITAMIN B-3 PO) Take 2 tablets by mouth daily.    [provider]  Omega-3 Fatty Acids (FISH OIL) 1200 MG CAPS Take 1 capsule by mouth in the morning and at bedtime.    [provider]  sacubitril-valsartan (ENTRESTO) 49-51 MG Take 1 tablet by mouth 2 (two) times daily. 09/27/20   [provider]  sodium bicarbonate 650 MG tablet Take 650 mg by mouth daily.    [provider]  spironolactone (ALDACTONE) 25 MG tablet Take 25 mg by mouth daily.     [provider]  torsemide (DEMADEX) 20 MG tablet Take 20 mg by mouth every other day. 06/04/20   [provider]  vitamin B-12 (CYANOCOBALAMIN) 100 MCG tablet Take 100 mcg by mouth daily.    [provider]  XARELTO 15 MG TABS tablet Take 15 mg by mouth daily. 03/05/22   [provider]    Physical Exam: Vitals:   03/04/24 1400 03/04/24 1425 03/04/24 1700 03/04/24 1720  BP: 117/61  109/66 119/69  Pulse: 61  66 73  Resp: 20  Temp:  97.7 F (36.5 C)  97.7 F (36.5 C)  TempSrc:  Oral  Oral  SpO2: 94%  97% 96%  Weight:    91.1 kg  Height:    5' 8 (1.727 m)    Physical Exam Gen:- Awake Alert, in no acute distress , dyspnea on minimal exertion HEENT:- [place].AT, No sclera icterus Nose- [place] 3L/min Neck-Supple Neck, +ve JVD,.  Lungs-fair air movement, bibasilar rales  CV- S1, S2 normal, IRRR, AICD in situ, prior CABG scar noted Abd-  +ve B.Sounds, Abd Soft, No tenderness,    Extremity/Skin:- +ve 2 pitting  edema,   good pedal pulses , right buttock wound noted Psych-affect is appropriate, oriented x3 Neuro-generalized weakness no new focal deficits, no tremors  Data Reviewed: In ED chest x-ray with small left-sided pleural effusion and cardiomegaly Creatinine is 3.25 review of Care Everywhere shows creatinine was 3.01 on 01/25/2024 and 3.3 on 12/16/2022 -LFTs are not elevated except for alk phos of 175 -Glucose is elevated at 253 -proBNP is 19,477 - WBC 7.3 hemoglobin 10.4 and platelets 130  Assessment and Plan: 1) acute systolic dysfunction CHF--- patient presenting with 17 to 20 pound weight gain over the last couple weeks with orthopnea, lower extremity edema, dyspnea and hypoxia. ---proBNP is 19,47725%,  -chest x-ray with small left-sided pleural effusion and cardiomegaly -Echo from 06/15/2023 with EF of 20 to 25 % with moderate to severe pulmonary hypertension and moderate TR -IV Lasix, daily weights, fluid input and output monitoring as  ordered  2) acute hypoxic respiratory failure---due to 1 above -Currently requiring 2 L of oxygen via nasal cannula  3)History of moderate to severe pulmonary hypertension and COPD---no COPD flareup -Pulmonary hypertension (noted on RHC on 12/10/2022 and again on echo from 06/15/2023) probably contributory to #1 above -- 4)CKD stage -IV -Creatinine is 3.25 review of Care Everywhere shows creatinine was 3.01 on 01/25/2024 and 3.3 on 12/16/2022 --renally adjust medications, avoid nephrotoxic agents / dehydration  / hypotension  5)history of prior left lower extremity DVT and PE, PAD, PAFib with history of prior stroke in 2012 and 2019,-on chronic anticoagulation with Xarelto -- Consider changing to Eliquis given worsening renal function  -- 6)H/o PAFib and sick sinus syndrome --- status post AICD --stable at this time  7)CAD with prior CABG in 2017 --no ACS type symptoms  8) chronic anemia and thrombocytopenia--hemoglobin 10.4 and platelets 130 - Suspect anemia is due to CKD - Monitor Hgb and platelets closely as patient is on Xarelto - No obvious bleeding concerns at this time  9) right buttock wound--- recently lanced by PCP - Please see photos in epic - Wound care consult requested   Advance Care Planning:   Code Status: Full Code   Family Communication: Wife and 2 daughters at bedside  Severity of Illness: The appropriate patient status for this patient is INPATIENT. Inpatient status is judged to be reasonable and necessary in order to provide the required intensity of service to ensure the patient's safety. The patient's presenting symptoms, physical exam findings, and initial radiographic and laboratory data in the context of their chronic comorbidities is felt to place them at high risk for further clinical deterioration. Furthermore, it is not anticipated that the patient will be medically stable for discharge from the hospital within 2 midnights of admission.   * I certify that  at the point of admission it is my clinical judgment that the patient will require inpatient hospital care spanning beyond 2 midnights from the point of  admission due to high intensity of service, high risk for further deterioration and high frequency of surveillance required.*  Author: Rendall Carwin, MD 03/04/2024 6:37 PM  For on call review [email or web address].

## 2024-03-05 ENCOUNTER — Observation Stay (HOSPITAL_COMMUNITY)

## 2024-03-05 DIAGNOSIS — E872 Acidosis, unspecified: Secondary | ICD-10-CM | POA: Diagnosis present

## 2024-03-05 DIAGNOSIS — Z95810 Presence of automatic (implantable) cardiac defibrillator: Secondary | ICD-10-CM | POA: Diagnosis not present

## 2024-03-05 DIAGNOSIS — N184 Chronic kidney disease, stage 4 (severe): Secondary | ICD-10-CM | POA: Diagnosis present

## 2024-03-05 DIAGNOSIS — J9601 Acute respiratory failure with hypoxia: Secondary | ICD-10-CM | POA: Diagnosis present

## 2024-03-05 DIAGNOSIS — Z7901 Long term (current) use of anticoagulants: Secondary | ICD-10-CM | POA: Diagnosis not present

## 2024-03-05 DIAGNOSIS — I4892 Unspecified atrial flutter: Secondary | ICD-10-CM | POA: Diagnosis present

## 2024-03-05 DIAGNOSIS — I25810 Atherosclerosis of coronary artery bypass graft(s) without angina pectoris: Secondary | ICD-10-CM | POA: Diagnosis not present

## 2024-03-05 DIAGNOSIS — I252 Old myocardial infarction: Secondary | ICD-10-CM | POA: Diagnosis not present

## 2024-03-05 DIAGNOSIS — I130 Hypertensive heart and chronic kidney disease with heart failure and stage 1 through stage 4 chronic kidney disease, or unspecified chronic kidney disease: Secondary | ICD-10-CM | POA: Diagnosis present

## 2024-03-05 DIAGNOSIS — I472 Ventricular tachycardia, unspecified: Secondary | ICD-10-CM | POA: Diagnosis not present

## 2024-03-05 DIAGNOSIS — I5023 Acute on chronic systolic (congestive) heart failure: Secondary | ICD-10-CM | POA: Diagnosis not present

## 2024-03-05 DIAGNOSIS — I5082 Biventricular heart failure: Secondary | ICD-10-CM | POA: Diagnosis present

## 2024-03-05 DIAGNOSIS — E1151 Type 2 diabetes mellitus with diabetic peripheral angiopathy without gangrene: Secondary | ICD-10-CM | POA: Diagnosis present

## 2024-03-05 DIAGNOSIS — J90 Pleural effusion, not elsewhere classified: Secondary | ICD-10-CM | POA: Diagnosis not present

## 2024-03-05 DIAGNOSIS — E785 Hyperlipidemia, unspecified: Secondary | ICD-10-CM | POA: Diagnosis present

## 2024-03-05 DIAGNOSIS — I509 Heart failure, unspecified: Secondary | ICD-10-CM | POA: Diagnosis present

## 2024-03-05 DIAGNOSIS — D631 Anemia in chronic kidney disease: Secondary | ICD-10-CM | POA: Diagnosis present

## 2024-03-05 DIAGNOSIS — I480 Paroxysmal atrial fibrillation: Secondary | ICD-10-CM | POA: Diagnosis present

## 2024-03-05 DIAGNOSIS — E1122 Type 2 diabetes mellitus with diabetic chronic kidney disease: Secondary | ICD-10-CM | POA: Diagnosis present

## 2024-03-05 DIAGNOSIS — I5043 Acute on chronic combined systolic (congestive) and diastolic (congestive) heart failure: Secondary | ICD-10-CM | POA: Diagnosis present

## 2024-03-05 DIAGNOSIS — M109 Gout, unspecified: Secondary | ICD-10-CM | POA: Diagnosis present

## 2024-03-05 DIAGNOSIS — N179 Acute kidney failure, unspecified: Secondary | ICD-10-CM | POA: Diagnosis not present

## 2024-03-05 DIAGNOSIS — I255 Ischemic cardiomyopathy: Secondary | ICD-10-CM | POA: Diagnosis present

## 2024-03-05 DIAGNOSIS — I272 Pulmonary hypertension, unspecified: Secondary | ICD-10-CM | POA: Diagnosis present

## 2024-03-05 DIAGNOSIS — I2510 Atherosclerotic heart disease of native coronary artery without angina pectoris: Secondary | ICD-10-CM | POA: Diagnosis present

## 2024-03-05 DIAGNOSIS — D6959 Other secondary thrombocytopenia: Secondary | ICD-10-CM | POA: Diagnosis present

## 2024-03-05 DIAGNOSIS — J449 Chronic obstructive pulmonary disease, unspecified: Secondary | ICD-10-CM | POA: Diagnosis present

## 2024-03-05 DIAGNOSIS — Z7982 Long term (current) use of aspirin: Secondary | ICD-10-CM | POA: Diagnosis not present

## 2024-03-05 DIAGNOSIS — E1165 Type 2 diabetes mellitus with hyperglycemia: Secondary | ICD-10-CM | POA: Diagnosis present

## 2024-03-05 LAB — ECHOCARDIOGRAM COMPLETE
AR max vel: 4.23 cm2
AV Area VTI: 3.95 cm2
AV Area mean vel: 3.97 cm2
AV Mean grad: 3.3 mmHg
AV Peak grad: 6.2 mmHg
Ao pk vel: 1.24 m/s
Area-P 1/2: 3.99 cm2
Calc EF: 34.4 %
Height: 68 in
S' Lateral: 5.2 cm
Single Plane A2C EF: 24 %
Single Plane A4C EF: 44.4 %
Weight: 3206.37 [oz_av]

## 2024-03-05 LAB — GLUCOSE, CAPILLARY
Glucose-Capillary: 121 mg/dL — ABNORMAL HIGH (ref 70–99)
Glucose-Capillary: 257 mg/dL — ABNORMAL HIGH (ref 70–99)
Glucose-Capillary: 175 mg/dL — ABNORMAL HIGH (ref 70–99)
Glucose-Capillary: 153 mg/dL — ABNORMAL HIGH (ref 70–99)

## 2024-03-05 LAB — HEMOGLOBIN A1C
Hgb A1c MFr Bld: 6.8 % — ABNORMAL HIGH (ref 4.8–5.6)
Mean Plasma Glucose: 148.46 mg/dL

## 2024-03-05 LAB — BASIC METABOLIC PANEL WITH GFR
Anion gap: 11 (ref 5–15)
BUN: 73 mg/dL — ABNORMAL HIGH (ref 8–23)
CO2: 23 mmol/L (ref 22–32)
Calcium: 8.2 mg/dL — ABNORMAL LOW (ref 8.9–10.3)
Chloride: 107 mmol/L (ref 98–111)
Creatinine, Ser: 3.45 mg/dL — ABNORMAL HIGH (ref 0.61–1.24)
GFR, Estimated: 17 mL/min — ABNORMAL LOW (ref >60)
Glucose, Bld: 127 mg/dL — ABNORMAL HIGH (ref 70–99)
Potassium: 4 mmol/L (ref 3.5–5.1)
Sodium: 141 mmol/L (ref 135–145)

## 2024-03-05 MED ORDER — FUROSEMIDE 10 MG/ML IJ SOLN: 40.0000 mg | Freq: Every day | INTRAMUSCULAR | Status: DC

## 2024-03-05 MED ORDER — SENNOSIDES-DOCUSATE SODIUM 8.6-50 MG PO TABS
2.0000 | ORAL_TABLET | Freq: Every day | ORAL | Status: DC
  Administered 2024-03-05 – 2024-03-09 (×5): 2 via ORAL
  Filled 2024-03-05 (×5): qty 2

## 2024-03-05 NOTE — Progress Notes (Signed)
  Transition of Care [facility]) Screening Note   Patient Details  Name: Tom Bartlett Date of Birth: 1945/07/11   Transition of Care [facility]) CM/SW Contact:    Hoy DELENA Bigness, LCSW Phone Number: 03/05/2024, 11:07 AM    Transition of Care Department [facility]) has reviewed patient and no TOC needs have been identified at this time. We will continue to monitor patient advancement through interdisciplinary progression rounds. If new patient transition needs arise, please place a TOC consult.    03/05/24 1107  TOC Brief Assessment  Insurance and Status Reviewed  Patient has primary care physician Yes  Home environment has been reviewed Home w/ spouse  Prior level of function: Independent  Prior/Current Home Services No current home services  Social Drivers of Health Review SDOH reviewed no interventions necessary  Readmission risk has been reviewed Yes  Transition of care needs transition of care needs identified, TOC will continue to follow

## 2024-03-05 NOTE — Progress Notes (Signed)
  Echocardiogram 2D Echocardiogram has been performed.  [place] R 03/05/2024, 11:02 AM

## 2024-03-05 NOTE — Progress Notes (Signed)
 PROGRESS NOTE  Tom Bartlett, is a 78 y.o. male, DOB - 1946-03-11, FMW:[id number]  Admit date - 03/04/2024   Admitting Physician Krystol Rocco Pearlean, MD  Outpatient Primary MD for the patient is Gerome Tillman CROME, FNP  LOS - 0  Chief Complaint  Patient presents with   Shortness of Breath   Bloated      Brief Narrative:  78 y.o. male with medical history significant for CKD 4, history of systolic dysfunction CHF (echo from 06/15/2023 with EF of 20 to 25%), moderate pulmonary hypertension noted on RHC on 12/10/2022 and again on echo from 06/15/2023, history of prior left lower extremity DVT and PE, PAD, PAFib with history of prior stroke in 2012 and 2019,-on chronic anticoagulation with Xarelto, AICD in situ, COPD, CAD with prior CABG in 2017 presents to the ED with complaints of dyspnea at rest, dyspnea on exertion, 17 to 20 pound weight gain over the last couple of weeks, orthopnea and worsening lower extremity edema admitted on 03/04/2024 with acute systolic dysfunction CHF exacerbation in the setting of CKD 4    -Assessment and Plan: 1)Acute systolic dysfunction CHF--- patient presented with 17 to 20 pound weight gain over the last couple weeks with orthopnea, lower extremity edema, dyspnea and hypoxia. ---proBNP is 19,477 on admission -chest x-ray on admission with small left-sided pleural effusion and cardiomegaly -Echo from 06/15/2023 with EF of 20 to 25 % with moderate to severe pulmonary hypertension and moderate TR -C/n iV Lasix, daily weights, fluid input and output monitoring as ordered  Intake/Output Summary (Last 24 hours) at 03/05/2024 1026 Last data filed at 03/05/2024 0700 Gross per 24 hour  Intake --  Output 2100 ml  Net -2100 ml   Wt 200.84 >>200.4 - Dyspnea and hypoxia persist- -C/n iV Lasix, daily weights, fluid input and output monitoring as ordered   2) acute hypoxic respiratory failure---due to #1 above -c/n 2 L of oxygen via nasal cannula   3)History of moderate to  severe pulmonary hypertension and COPD---no COPD flareup -Pulmonary hypertension (noted on RHC on 12/10/2022 and again on echo from 06/15/2023) probably contributory to #1 above --Dyspnea , hypoxia and lower extremity edema persist -Manage as above #1 and #2 -- 4)CKD stage -IV -Creatinine is 3.25 review of Care Everywhere shows creatinine was 3.01 on 01/25/2024 and 3.3 on 12/16/2022 --Creatinine is up to 3.45 with diuresis, BUN also trending up slightly --renally adjust medications, avoid nephrotoxic agents / dehydration  / hypotension   5)history of prior left lower extremity DVT and PE, PAD, PAFib with history of prior stroke in 2012 and 2019,-on chronic anticoagulation with Xarelto -- Consider changing to Eliquis given worsening renal function  -- 6)H/o PAFib and sick sinus syndrome --- status post AICD --stable at this time   7)CAD with prior CABG in 2017 --no ACS type symptoms -Continue Coreg =-Patient and family would like to stop aspirin due to concerns about bleeding -He is already on Xarelto for A-fib and prior history of VTE and PAD and has anemia and thrombocytopenia --Okay to stop aspirin -  8) chronic anemia and thrombocytopenia--hemoglobin 10.4 and platelets 130 - Suspect anemia is due to CKD - Monitor Hgb and platelets closely as patient is on Xarelto - No obvious bleeding concerns at this time   9) right buttock wound--- recently lanced by PCP - Please see photos in epic - Wound care consult appreciated, please see recommendations in epic  10)Socia/Ethics--plan of care discussed with patient, patient's wife and patient's 2 daughters -  Patient remains a full code  Status is: Inpatient   Disposition: The patient is from: Home              Anticipated d/c is to: Home              Anticipated d/c date is: 2 days              Patient currently is not medically stable to d/c. Barriers: Not Clinically Stable-   Code Status :  -  Code Status: Full Code   Family  Communication:   NA (patient is alert, awake and coherent)   discussed with patient, patient's wife and patient's 2 daughters  DVT Prophylaxis  :   - SCDs  SCDs Start: 03/04/24 1237 Place TED hose Start: 03/04/24 1237 Rivaroxaban (XARELTO) tablet 15 mg   Lab Results  Component Value Date   PLT 130 (L) 03/04/2024    Inpatient Medications  Scheduled Meds:  aspirin EC  81 mg Oral Q breakfast   carvedilol  12.5 mg Oral BID   [START ON 03/06/2024] furosemide  40 mg Intravenous Daily   insulin aspart  0-5 Units Subcutaneous QHS   insulin aspart  0-6 Units Subcutaneous TID WC   Rivaroxaban  15 mg Oral Daily   sodium bicarbonate  650 mg Oral Daily   sodium chloride  flush  3 mL Intravenous Q12H   sodium chloride  flush  3 mL Intravenous Q12H   spironolactone  25 mg Oral Daily   cyanocobalamin  100 mcg Oral Daily   Continuous Infusions:  sodium chloride      PRN Meds:.sodium chloride , acetaminophen **OR** acetaminophen, bisacodyl, ondansetron **OR** ondansetron (ZOFRAN) IV, polyethylene glycol, sodium chloride  flush, traZODone   Anti-infectives (From admission, onward)    None         Subjective: Tom Bartlett today has no fevers, no emesis,  No frank chest pain,   - Wife and daughter at bedside -Voiding well - Conversational dyspnea persist - Concerns about episodes of nosebleed patient and family requesting that aspirin be held  --he is on Xarelto for A-fib/PAD/history of VTE   Objective: Vitals:   03/05/24 0115 03/05/24 0343 03/05/24 0510 03/05/24 0951  BP: (!) 104/49  110/63 124/62  Pulse: (!) 59  60 64  Resp: 18     Temp: 98 F (36.7 C)  97.9 F (36.6 C)   TempSrc: Oral  Oral   SpO2: 96%  93%   Weight:  90.9 kg    Height:        Intake/Output Summary (Last 24 hours) at 03/05/2024 1022 Last data filed at 03/05/2024 0700 Gross per 24 hour  Intake --  Output 2100 ml  Net -2100 ml   Filed Weights   03/04/24 1256 03/04/24 1720 03/05/24 0343  Weight: 94.8 kg  91.1 kg 90.9 kg    Physical Exam Gen:- Awake Alert, conversational dyspnea noted HEENT:- [place].AT, No sclera icterus Nose- [place] 2L/min Neck-Supple Neck, +ve JVD,.  Lungs-  fair symmetrical air movement, bibasilar rales noted CV- S1, S2 normal, irregular ,AICD in situ, prior CABG scar  Abd-  +ve B.Sounds, Abd Soft, No tenderness,    Extremity/Skin:- +ve 2 pitting  edema , pedal pulses present  Psych-affect is appropriate, oriented x3 Neuro-generalized weakness, no new focal deficits, no tremors  Data Reviewed: I have personally reviewed following labs and imaging studies  CBC: Recent Labs  Lab 03/04/24 0954  WBC 7.3  NEUTROABS 5.8  HGB 10.4*  HCT 34.1*  MCV 106.9*  PLT 130*   Basic Metabolic Panel: Recent Labs  Lab 03/04/24 0954 03/05/24 0359  NA 139 141  K 4.1 4.0  CL 104 107  CO2 22 23  GLUCOSE 253* 127*  BUN 70* 73*  CREATININE 3.25* 3.45*  CALCIUM 8.7* 8.2*   GFR: Estimated Creatinine Clearance: 19 mL/min (A) (by C-G formula based on SCr of 3.45 mg/dL (H)). Liver Function Tests: Recent Labs  Lab 03/04/24 0954  AST 23  ALT 27  ALKPHOS 175*  BILITOT 0.7  PROT 6.2*  ALBUMIN 3.9   BNP (last 3 results) Recent Labs    03/04/24 0954  PROBNP 19,477.0*   Radiology Studies: [facility] Chest Port 1 View Result Date: 03/04/2024 CLINICAL DATA:  Shortness of breath and 17 pound weight gain over 1 week. EXAM: PORTABLE CHEST 1 VIEW COMPARISON:  06/22/2023 FINDINGS: Sternotomy wires unchanged. Left-sided pacemaker unchanged. Lungs are adequately inflated with minimal blunting of the left costophrenic angle which is new. This likely represents a small amount left pleural fluid and associated atelectasis. Mild stable cardiomegaly. Remainder of the exam is unchanged. IMPRESSION: 1. New minimal blunting of the left costophrenic angle likely small amount of left pleural fluid and associated atelectasis. 2. Stable cardiomegaly. Electronically Signed   By: Toribio Agreste M.D.   On:  03/04/2024 10:19   Scheduled Meds:  aspirin EC  81 mg Oral Q breakfast   carvedilol  12.5 mg Oral BID   [START ON 03/06/2024] furosemide  40 mg Intravenous Daily   insulin aspart  0-5 Units Subcutaneous QHS   insulin aspart  0-6 Units Subcutaneous TID WC   Rivaroxaban  15 mg Oral Daily   sodium bicarbonate  650 mg Oral Daily   sodium chloride  flush  3 mL Intravenous Q12H   sodium chloride  flush  3 mL Intravenous Q12H   spironolactone  25 mg Oral Daily   cyanocobalamin  100 mcg Oral Daily   Continuous Infusions:  sodium chloride       LOS: 0 days   Rendall Carwin M.D on 03/05/2024 at 10:22 AM  Go to [email or web address] - for contact info  [facility] - Office  [phone]  If 7PM-7AM, please contact night-coverage [email or web address] 03/05/2024, 10:22 AM

## 2024-03-05 NOTE — Consult Note (Signed)
 WOC Nurse Consult Note: Reason for Consult: buttock wound Per MD notes, area was lanced by PCP. However appears chronic with epibole of wound edges, unclear etiology, does not present like pressure Wound type: full thickness lesion; right buttock Pressure Injury POA: NA Measurement: see nursing flow sheets Wound azi:aloanld lesion, clean Drainage (amount, consistency, odor) see nursing flow sheets Periwound:intact, skin changes consistant with chronic wound Dressing procedure/placement/frequency: Silicone foam dressings to the buttock wound,      change every 3 days. ASSESS UNDER dressings each shift for any acute changes in the wounds.     Re consult if needed, will not follow at this time. Thanks  Rashaan Wyles [employer], RN,CWOCN, CNS, [employer] [phone]

## 2024-03-05 NOTE — Plan of Care (Signed)

## 2024-03-06 ENCOUNTER — Encounter (HOSPITAL_COMMUNITY): Payer: Self-pay | Admitting: Family Medicine

## 2024-03-06 DIAGNOSIS — Z951 Presence of aortocoronary bypass graft: Secondary | ICD-10-CM

## 2024-03-06 DIAGNOSIS — I5082 Biventricular heart failure: Secondary | ICD-10-CM

## 2024-03-06 DIAGNOSIS — I25810 Atherosclerosis of coronary artery bypass graft(s) without angina pectoris: Secondary | ICD-10-CM

## 2024-03-06 DIAGNOSIS — Z7901 Long term (current) use of anticoagulants: Secondary | ICD-10-CM

## 2024-03-06 DIAGNOSIS — I5043 Acute on chronic combined systolic (congestive) and diastolic (congestive) heart failure: Secondary | ICD-10-CM

## 2024-03-06 DIAGNOSIS — Z95810 Presence of automatic (implantable) cardiac defibrillator: Secondary | ICD-10-CM

## 2024-03-06 DIAGNOSIS — J90 Pleural effusion, not elsewhere classified: Secondary | ICD-10-CM

## 2024-03-06 LAB — BASIC METABOLIC PANEL WITH GFR
Anion gap: 9 (ref 5–15)
BUN: 74 mg/dL — ABNORMAL HIGH (ref 8–23)
CO2: 26 mmol/L (ref 22–32)
Calcium: 8.5 mg/dL — ABNORMAL LOW (ref 8.9–10.3)
Chloride: 103 mmol/L (ref 98–111)
Creatinine, Ser: 3.08 mg/dL — ABNORMAL HIGH (ref 0.61–1.24)
GFR, Estimated: 20 mL/min — ABNORMAL LOW (ref >60)
Glucose, Bld: 198 mg/dL — ABNORMAL HIGH (ref 70–99)
Potassium: 4.6 mmol/L (ref 3.5–5.1)
Sodium: 137 mmol/L (ref 135–145)

## 2024-03-06 LAB — GLUCOSE, CAPILLARY
Glucose-Capillary: 144 mg/dL — ABNORMAL HIGH (ref 70–99)
Glucose-Capillary: 184 mg/dL — ABNORMAL HIGH (ref 70–99)
Glucose-Capillary: 159 mg/dL — ABNORMAL HIGH (ref 70–99)
Glucose-Capillary: 198 mg/dL — ABNORMAL HIGH (ref 70–99)

## 2024-03-06 MED ORDER — CARVEDILOL 3.125 MG PO TABS
3.1250 mg | ORAL_TABLET | Freq: Two times a day (BID) | ORAL | Status: DC
Start: 2024-03-06 — End: 2024-03-06

## 2024-03-06 MED ORDER — FUROSEMIDE 10 MG/ML IJ SOLN
15.0000 mg/h | INTRAVENOUS | Status: DC
  Administered 2024-03-06 – 2024-03-09 (×5): 15 mg/h via INTRAVENOUS
  Filled 2024-03-06 (×8): qty 20

## 2024-03-06 MED ORDER — FUROSEMIDE 10 MG/ML IJ SOLN
80.0000 mg | Freq: Two times a day (BID) | INTRAMUSCULAR | Status: DC
  Administered 2024-03-06: 80 mg via INTRAVENOUS
  Filled 2024-03-06: qty 8

## 2024-03-06 NOTE — Consult Note (Signed)
 Cardiology Consultation   Patient ID: DEYTON ELLENBECKER MRN: [id number]; DOB: [birth year]  Admit date: 03/04/2024 Date of Consult: 03/06/2024  PCP:  Gerome Tillman CROME, FNP   [facility] Providers Cardiologist:  None   Followed by [facility]   Patient Profile: Tom Bartlett is a 78 y.o. 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 Trixie.  History of Present Illness: Tom Bartlett is followed by [facility].  ECHO 05/2023 showed EF 20 to 25%, severe global hypokinesis, severely dilated LV, severely dilated LA, mild MV regurgitation, mild to moderate TV regurgitation, moderate to severe pulmonary hypertension, dilated IVC C/W elevated RA pressures.  Cardiac cath in 05/2023 showed patent LIMA to LAD, patent SVG to OM 3, C/W stable CAD, and recommended medical therapy.  Last seen by Dr. Meldon in 06/16/2023.  Doing well from cardiac standpoint without any complaints.  Continued on amiodarone 200 mg twice daily, Lasix 80 mg daily, Imdur 30 mg daily, Toprol-XL 50 mg twice daily, Xarelto 15 mg daily.  Presented to [facility] 03/04/2024 for dyspnea at rest, dyspnea on exertion, 17 to 20 pound weight gain over the last couple of weeks, orthopnea and worsening lower extremity edema.  Initial Labs > Most recent: K 4.1 >4.6, Cr 3.25 > 3.08, pro BNP 19477, WBC WNL, stable HGB 10.4, Plt 130K EKG: NSR vs Afib, HR 77, wandering baseline (Last previous in 2022 with NSR.). Tele: A paced, HR 60's CXR showed stable cardiomegaly with new minimal blunting of the left costophrenic angle likely small amount of left pleural fluid and associated  atelectasis. Echo 03/05/2024 showed EF 35 to 40%, global hypokinesis, mildly dilated LV, mild LVH, G2 DD, elevated LA pressures, C/W RV overload, RV was not well-visualized but noted grossly appearing severely enlarged with severely decreased systolic function, mildly elevated PASP with RVSP 40.8 mmHG, LA mildly dilated, mild MV regurgitation, mild calcification/thickening of AV w/o AS, mild dilation of aortic root at 40 mm, mild dilation of ascending aorta at 43 mm, dilated IVC.  Treated with IV lasix 40 mg x3 then increased to 80 mg BID. D/C Coreg to allow more room for diuresis.  Started on spironolactone 25 mg daily. I/O -2240, wt 200 > 202.  ASA was stopped per family request due to concerns for bleeding.   On interview, patient is accompanied by wife and daughter who assists with history.  Reports worsening SOB with minimal exertion, edema in LE/abdomen, worsening orthopnea and 20 pound weight gain over the last 4 to 6 weeks.  Patient and family denies any improvement since hospitalization with IV Lasix.  Family notes 2 recent gout flares that was treated with steroids in 12/2023 and 01/2024, which they believe is a significant contributing factor.  Patient is not on O2 at home.  Patient is currently on 2L [place].  Reports daily medication compliance.  Notes low-sodium diet and following water recommendation per nephrology.  Denies any chest pain, palpitations, syncope.    Past Medical History:  Diagnosis Date   AICD (automatic cardioverter/defibrillator) present    Anemia    CAD (coronary artery disease)    Chronic systolic heart  failure (HCC)    Chronic venous insufficiency    CKD (chronic kidney disease)    Stage 3   COPD (chronic obstructive pulmonary disease) (HCC)    Diabetes (HCC)    DVT (deep venous thrombosis) (HCC) 1979   Left Leg   Dyslipidemia    Hypertension    Kidney stones    Myocardial infarction (HCC) 05/18/2005   Obstructive sleep apnea    PAF (paroxysmal atrial  fibrillation) (HCC)    Peripheral artery disease    Prostate cancer (HCC)    Pulmonary embolism (HCC) 2003   Sick sinus syndrome (HCC)    Stroke (HCC)    2012, 2019  Some right arm numbness    Past Surgical History:  Procedure Laterality Date   CARDIAC CATHETERIZATION     2007,  2023   CATARACT EXTRACTION W/PHACO Right 07/06/2022   Procedure: CATARACT EXTRACTION PHACO AND INTRAOCULAR LENS PLACEMENT (IOC) RIGHT DIABETIC  2.74  00:24.0;  Surgeon: Myrna Adine Anes, MD;  Location: [facility];  Service: Ophthalmology;  Laterality: Right;  Diabetic   CHOLECYSTECTOMY     COLONOSCOPY     CORONARY ARTERY BYPASS GRAFT  08/09/2015   4 vessel  [facility]   FINGER SURGERY Right    ICD IMPLANT  02/2022   KNEE SURGERY     R knee x 2, L knee x 1   ROTATOR CUFF REPAIR Left    UMBILICAL HERNIA REPAIR       Home Medications:  Prior to Admission medications   Medication Sig Start Date End Date Taking? Authorizing Provider  allopurinol (ZYLOPRIM) 100 MG tablet Take 100 mg by mouth daily.   Yes [provider]  amiodarone (PACERONE) 200 MG tablet Take 200 mg by mouth 2 (two) times daily. 06/23/23  Yes [provider]  Calcium Polycarbophil (FIBER) 625 MG TABS Take 1 tablet by mouth in the morning and at bedtime.   Yes [provider]  Cholecalciferol (VITAMIN D3) 50 MCG (2000 UT) capsule Take 2,000 Units by mouth daily.   Yes [provider]  Cyanocobalamin (VITAMIN B12) 1000 MCG TABS Take 1,000 mcg by mouth daily.   Yes [provider]  glipiZIDE (GLUCOTROL XL) 2.5 MG 24 hr tablet Take 2.5 mg by mouth daily with breakfast.   Yes [provider]  Iron-FA-B Cmp-C-Biot-Probiotic (FUSION PLUS) CAPS Take 1 capsule by mouth daily. 04/02/21  Yes [provider]  isosorbide mononitrate (IMDUR) 30 MG 24 hr tablet Take 30 mg by mouth daily. 12/11/22  Yes [provider]  metoprolol succinate (TOPROL-XL) 50 MG 24 hr tablet Take 50 mg by  mouth 2 (two) times daily.   Yes [provider]  Multiple Vitamins-Minerals (EYE HEALTH) CAPS Take 1 capsule by mouth in the morning and at bedtime.   Yes [provider]  torsemide (DEMADEX) 20 MG tablet Take 20 mg by mouth daily. 06/04/20  Yes [provider]  XARELTO 15 MG TABS tablet Take 15 mg by mouth daily with supper. 03/05/22  Yes [provider]  AMBULATORY NON FORMULARY MEDICATION Abdominal Binder - use as directed 05/21/21   Mansouraty, Gabriel Jr., MD  ciprofloxacin (CIPRO) 500 MG tablet Take 500 mg by mouth 2 (two) times daily. Patient not taking: Reported on 03/04/2024 02/14/24   [provider]    Scheduled Meds:  insulin aspart  0-5 Units Subcutaneous QHS   insulin aspart  0-6 Units Subcutaneous TID WC   Rivaroxaban  15 mg Oral Daily  senna-docusate  2 tablet Oral QHS   sodium bicarbonate  650 mg Oral Daily   sodium chloride  flush  3 mL Intravenous Q12H   sodium chloride  flush  3 mL Intravenous Q12H   spironolactone  25 mg Oral Daily   cyanocobalamin  100 mcg Oral Daily   Continuous Infusions:  furosemide (LASIX) 200 mg in dextrose 5 % 100 mL (2 mg/mL) infusion     PRN Meds: acetaminophen **OR** acetaminophen, bisacodyl, ondansetron **OR** ondansetron (ZOFRAN) IV, polyethylene glycol, sodium chloride  flush, traZODone  Allergies:    Allergies  Allergen Reactions   Statins Other (See Comments)    Muscle aches    Social History:   Social History   Socioeconomic History   Marital status: Married    Spouse name: Not on file   Number of children: Not on file   Years of education: Not on file   Highest education level: Not on file  Occupational History   Not on file  Tobacco Use   Smoking status: Former    Types: Cigarettes   Smokeless tobacco: Never   Tobacco comments:    Quit over 40 yrs ago  Vaping Use   Vaping status: Never Used  Substance and Sexual Activity   Alcohol use: Not Currently    Comment: quit  age 62   Drug use: Never   Sexual activity: Not on file  Other Topics Concern   Not on file  Social History Narrative   Not on file   Family History:   Family History  Problem Relation Age of Onset   Dementia Mother    Diabetes Father    Heart disease Father    Congestive Heart Failure Father    Heart disease Sister    Heart disease Brother    Colon cancer Neg Hx    Stomach cancer Neg Hx    Pancreatic cancer Neg Hx    Esophageal cancer Neg Hx    Inflammatory bowel disease Neg Hx    Liver disease Neg Hx    Rectal cancer Neg Hx      ROS:  Please see the history of present illness.  All other ROS reviewed and negative.     Physical Exam/Data: Vitals:   03/05/24 1329 03/05/24 2034 03/06/24 0411 03/06/24 0415  BP: (!) 109/54 125/63  118/61  Pulse: 62 66  65  Resp: 18 20  18   Temp: 97.6 F (36.4 C) 98 F (36.7 C)  97.6 F (36.4 C)  TempSrc: Oral Oral  Oral  SpO2: 97% 95%  95%  Weight:   91.8 kg   Height:        Intake/Output Summary (Last 24 hours) at 03/06/2024 1326 Last data filed at 03/06/2024 1223 Gross per 24 hour  Intake 480 ml  Output 1000 ml  Net -520 ml      03/06/2024    4:11 AM 03/05/2024    3:43 AM 03/04/2024    5:20 PM  Last 3 Weights  Weight (lbs) 202 lb 6.1 oz 200 lb 6.4 oz 200 lb 13.4 oz  Weight (kg) 91.8 kg 90.9 kg 91.1 kg     Body mass index is 30.77 kg/m.  General:  Well nourished, well developed, in no acute distress; 2 L [place]  HEENT: normal Neck: +JVD Vascular: No carotid bruits; Distal pulses 2+ bilaterally Cardiac:  normal S1, S2; RRR; no murmur  Lungs:  diminished breath sound in lung bases with R > L.  Abd: soft, nontender, no hepatomegaly  Ext: 2+ pitting edema, wearing TED hose. Musculoskeletal:  No deformities, BUE and BLE strength normal and equal Skin: warm and dry  Neuro:  CNs 2-12 intact, no focal abnormalities noted Psych:  Normal affect   EKG:  The EKG was personally reviewed and demonstrates:  Afib, HR 77,  wandering baseline (Last previous in 2022 with NSR.)  Telemetry:  Telemetry was personally reviewed and demonstrates:  A paced, HR 60's  Relevant CV Studies: Cath 06/15/2023 at [facility] DIAGNOSTIC FINDINGS:    1.  Patent LIMA-LAD and patent SVG-OM 3  2.  Chronically occluded SVG-OM and SVG-RCA (as documented previously at  [facility])  3. Native CAD is in the diagram.  4.  The coronary anatomy appears unchanged compared to the results from  the last heart catheterization at [facility] in 2022.   ECHO 03/05/2024 IMPRESSIONS   1. Left ventricular ejection fraction, by estimation, is 35 to 40%. The  left ventricle has moderately decreased function. The left ventricle  demonstrates global hypokinesis. The left ventricular internal cavity size  was mildly dilated. There is mild  left ventricular hypertrophy. Left ventricular diastolic parameters are  consistent with Grade II diastolic dysfunction (pseudonormalization).  Elevated left atrial pressure. There is the interventricular septum is  flattened in systole and diastole,  consistent with right ventricular pressure and volume overload.   2. Rv not well visualized. Grossly appears severely enlarged with  severely decreased systolic function. . Right ventricular systolic  function was not well visualized. The right ventricular size is not well  visualized. There is mildly elevated pulmonary  artery systolic pressure. The estimated right ventricular systolic  pressure is 40.8 mmHg.   3. Left atrial size was mildly dilated.   4. Large pleural effusion.   5. The mitral valve is abnormal. Mild mitral valve regurgitation. No  evidence of mitral stenosis.   6. The tricuspid valve is abnormal.   7. The aortic valve is tricuspid. There is mild calcification of the  aortic valve. There is mild thickening of the aortic valve. Aortic valve  regurgitation is not visualized. No aortic stenosis is present.   8. Aortic dilatation noted. There is mild  dilatation of the aortic root,  measuring 40 mm. There is mild dilatation of the ascending aorta,  measuring 43 mm.   9. The inferior vena cava is dilated in size with <50% respiratory  variability, suggesting right atrial pressure of 15 mmHg.   Laboratory Data: High Sensitivity Troponin:  No results for input(s): TROPONINIHS in the last 720 hours.   Chemistry Recent Labs  Lab 03/04/24 0954 03/05/24 0359 03/06/24 1012  NA 139 141 137  K 4.1 4.0 4.6  CL 104 107 103  CO2 22 23 26   GLUCOSE 253* 127* 198*  BUN 70* 73* 74*  CREATININE 3.25* 3.45* 3.08*  CALCIUM 8.7* 8.2* 8.5*  GFRNONAA 19* 17* 20*  ANIONGAP 12 11 9     Recent Labs  Lab 03/04/24 0954  PROT 6.2*  ALBUMIN 3.9  AST 23  ALT 27  ALKPHOS 175*  BILITOT 0.7   Lipids No results for input(s): CHOL, TRIG, HDL, LABVLDL, LDLCALC, CHOLHDL in the last 168 hours.  Hematology Recent Labs  Lab 03/04/24 0954  WBC 7.3  RBC 3.19*  HGB 10.4*  HCT 34.1*  MCV 106.9*  MCH 32.6  MCHC 30.5  RDW 17.8*  PLT 130*   Thyroid No results for input(s): TSH, FREET4 in the last 168 hours.  BNP Recent Labs  Lab 03/04/24 [phone]  PROBNP 19,477.0*    DDimer No results for input(s): DDIMER in the last 168 hours.  Radiology/Studies:  DG Chest Port 1 View Result Date: 03/04/2024 CLINICAL DATA:  Shortness of breath and 17 pound weight gain over 1 week. EXAM: PORTABLE CHEST 1 VIEW COMPARISON:  06/22/2023 FINDINGS: Sternotomy wires unchanged. Left-sided pacemaker unchanged. Lungs are adequately inflated with minimal blunting of the left costophrenic angle which is new. This likely represents a small amount left pleural fluid and associated atelectasis. Mild stable cardiomegaly. Remainder of the exam is unchanged. IMPRESSION: 1. New minimal blunting of the left costophrenic angle likely small amount of left pleural fluid and associated atelectasis. 2. Stable cardiomegaly. Electronically Signed   By: Toribio Agreste M.D.   On:  03/04/2024 10:19   Assessment and Plan: Acute on chronic HFrEF Pulmonary HTN  Presented with worsening DOE/orthopnea, edema in LE/abdomen and 20 pound weight gain over the last 4 to 6 weeks.  No improvement with IV Lasix. pro BNP 19477; CXR c/w with volume overload.  ECHO 03/05/2024 showed EF 35 to 40%, global hypokinesis, mildly dilated LV, mild LVH, G2 DD, elevated LA pressures, C/W RV overload, RV was not well-visualized but noted grossly appearing severely enlarged with severely decreased systolic function, mildly elevated PASP with RVSP 40.8 mmHG, LA mildly dilated, mild MV regurgitation, mild calcification/thickening of AV w/o AS, mild dilation of aortic root at 40 mm, mild dilation of ascending aorta at 43 mm, dilated IVC.  Treated with IV lasix 40 mg x3 then increased to 80 mg BID. D/C Coreg to allow for more room with diuresis.  Started on spironolactone 25 mg daily. I/O -2240, wt 200 > 202  Patient still appears volume overloaded. Per Dr. Mallipeddi, d/c IV lasix and order lasix drip at 15 mg/hr.  Continue Spiro 25  mg daily.  Continue to monitor I's/O's, daily weight/renal function. Currently wearing TED hose. Would consider SCD. Will defer to primary team.   Acute hypoxic respiratory failure  Not on home O2 Continue 2 L [place]. Wean off to room air as tolerated.   AKI on CKD Cr 3.25 > 3.08 (baseline Cr 3.0 -3.3)  Continue to monitor   HTN  BP well controlled 118/61 D/C coreg to allow more room with diuresis.  Continue Spiro 25 mg daily   Hx of DVT/ PE Continue Xarelto   Hx of PAF/SSS  ICD in place  EKG: NSR vs Afib, HR 77, wandering baseline (Last previous in 2022 with NSR.).  Order repeat EKG. No signs of afib on telemetry. Tele: A paced, HR 60's  CAD s/p CABG in 2017 Denies any angina symptoms. No need for further ischemic evaluation at this time.  Per family, patient was not on ASA prior to this hospitalization. Agree with family to d/c ASA. Not on ASA given AC use.      Risk Assessment/Risk Scores:  [place]  Heart Association (NYHA) Functional Class NYHA Class III  CHA2DS2-VASc Score = 8   This indicates a 10.8% annual risk of stroke. The patient's score is based upon: CHF History: 1 HTN History: 1 Diabetes History: 1 Stroke History: 2 Vascular Disease History: 1 Age Score: 2 Gender Score: 0        For questions or updates, please contact [facility] Please consult [email or web address] for contact info under      Signed, Lorette CINDERELLA Kapur, PA-C  03/06/2024 1:26 PM

## 2024-03-06 NOTE — Progress Notes (Signed)
 Heart Failure Navigator Progress Note  Heart Failure Navigation consult placed.  Attempted to call patient at [facility] room 302 remotely.  Busy signal on his phone at this time.  Reached out to his nurse Angle Ray, RN to have her provide patient with the CHF education folder.  Will try to reach patient again prior to discharge to provide him with CHF education and make sure he has a CHF hospital follow-up scheduled.  Charmaine Pines, RN, BSN [facility] Heart Failure Navigator Secure Chat Only

## 2024-03-06 NOTE — Plan of Care (Signed)

## 2024-03-06 NOTE — Progress Notes (Addendum)
 PROGRESS NOTE  Tom Bartlett FMW:[id number] DOB: 07/30/1945 DOA: 03/04/2024 PCP: Gerome Tillman CROME, FNP   LOS: 1 day   Brief Narrative / Interim history: 78 year old male with history of CKD 4, chronic combined CHF, pulmonary hypertension, prior DVT and PE, PAF, prior CVA on chronic anticoagulation with Xarelto, AICD, COPD, CAD with CABG in 2017 comes into the hospital with roughly 20 pound weight gain in the last few weeks as well as increasing shortness of breath and dyspnea with minimal activities.  He reports compliance with his home medication as well as a low-sodium diet  Subjective / 24h Interval events: Continues to feel short of breath this morning.  He is surprised to see that he has gained 2 pounds since yesterday.  He tells me that his baseline weight is about 181 pounds, and he was 202 this morning.  He also reports poor urine output over the last 24 hours  Assesement and Plan: Principal problem Acute combined CHF -patient was admitted to the hospital with significant fluid overload, about 20 pounds, associated with orthopnea, edema, dyspnea on exertion - proBNP was elevated on admission, chest x-ray showed pleural effusion and cardiomegaly -2D echocardiogram done 10/19 showed LVEF 35-40%, grade 2 diastolic dysfunction, RV appeared severely enlarged with decreased systolic function. - He was on furosemide 40 mg daily, however had poor response, will increase to 80 mg twice daily, monitor in and outs, daily weights, placed on fluid restriction today - Consult cardiology  Active problems AKI on CKD stage IV -baseline creatinine earlier in February 2025 around 2.3-2.5.  He tells me he was hospitalized around that time for V-fib arrest with ICD firing.  He tells me he was briefly on dialysis but kidney function has stabilized since - Currently creatinine in the mid 3 range, I will increase the furosemide today since he does not seem to be responding to current dosing.  If he continues to  lack response and creatinine goes up, needs nephrology consultation in the morning  Acute hypoxic respiratory failure-due to #1, patient is tachypneic at times and has increased respiratory effort especially with minimal activity.  Continue supplemental oxygen, wean off to room air as tolerated  History of COPD-no wheezing, current symptoms mostly due to fluid  Essential hypertension -decreased carvedilol dose to allow more diuresis  History of DVT/PE -continue Xarelto.  Based on where the kidney function stabilizes when close to discharge, could consider changing to Eliquis  History of PAF, sick sinus syndrome -he has an ICD in place.  This is stable  CAD, CABG in 2017-continue Coreg, no chest pain or ACS type symptoms.  Per family they wish to stop aspirin due to concerns about bleeding  Chronic anemia, thrombocytopenia-in the setting of chronic kidney disease, no bleeding  Right buttock wound-recently lanced by PCP.  Wound care consulted.  He is afebrile, normotensive, white count is normal  BMI 30.7.  He does not appear to have obesity, will have to reevaluate BMI after diuresis  Scheduled Meds:  carvedilol  12.5 mg Oral BID   furosemide  80 mg Intravenous Q12H   insulin aspart  0-5 Units Subcutaneous QHS   insulin aspart  0-6 Units Subcutaneous TID WC   Rivaroxaban  15 mg Oral Daily   senna-docusate  2 tablet Oral QHS   sodium bicarbonate  650 mg Oral Daily   sodium chloride  flush  3 mL Intravenous Q12H   sodium chloride  flush  3 mL Intravenous Q12H   spironolactone  25 mg Oral  Daily   cyanocobalamin  100 mcg Oral Daily   Continuous Infusions: PRN Meds:.acetaminophen **OR** acetaminophen, bisacodyl, ondansetron **OR** ondansetron (ZOFRAN) IV, polyethylene glycol, sodium chloride  flush, traZODone  Current Outpatient Medications  Medication Instructions   allopurinol (ZYLOPRIM) 100 mg, Daily   AMBULATORY NON FORMULARY MEDICATION Abdominal Binder - use as directed    amiodarone (PACERONE) 200 mg, 2 times daily   Calcium Polycarbophil (FIBER) 625 MG TABS 1 tablet, 2 times daily   ciprofloxacin (CIPRO) 500 mg, 2 times daily   glipiZIDE (GLUCOTROL XL) 2.5 mg, Daily with breakfast   Iron-FA-B Cmp-C-Biot-Probiotic (FUSION PLUS) CAPS 1 capsule, Daily   isosorbide mononitrate (IMDUR) 30 mg, Daily   metoprolol succinate (TOPROL-XL) 50 mg, 2 times daily   Multiple Vitamins-Minerals (EYE HEALTH) CAPS 1 capsule, 2 times daily   torsemide (DEMADEX) 20 mg, Daily   Vitamin B12 1,000 mcg, Daily   Vitamin D3 2,000 Units, Daily   Xarelto 15 mg, Daily with supper    Diet Orders (From admission, onward)     Start     Ordered   03/04/24 1237  Diet Heart Room service appropriate? Yes; Fluid consistency: Thin  Diet effective now       Question Answer Comment  Room service appropriate? Yes   Fluid consistency: Thin      03/04/24 1241            DVT prophylaxis: SCDs Start: 03/04/24 1237 Place TED hose Start: 03/04/24 1237 Rivaroxaban (XARELTO) tablet 15 mg   Lab Results  Component Value Date   PLT 130 (L) 03/04/2024      Code Status: Full Code  Family Communication: no family at bedside   Status is: Inpatient Remains inpatient appropriate because: severity of illness  Level of care: Telemetry  Consultants:  none  Objective: Vitals:   03/05/24 1329 03/05/24 2034 03/06/24 0411 03/06/24 0415  BP: (!) 109/54 125/63  118/61  Pulse: 62 66  65  Resp: 18 20  18   Temp: 97.6 F (36.4 C) 98 F (36.7 C)  97.6 F (36.4 C)  TempSrc: Oral Oral  Oral  SpO2: 97% 95%  95%  Weight:   91.8 kg   Height:        Intake/Output Summary (Last 24 hours) at 03/06/2024 0932 Last data filed at 03/06/2024 0916 Gross per 24 hour  Intake 360 ml  Output 500 ml  Net -140 ml   Wt Readings from Last 3 Encounters:  03/06/24 91.8 kg  07/06/22 91.6 kg  05/20/21 90.3 kg    Examination:  Constitutional: NAD Eyes: no scleral icterus ENMT: Mucous membranes are  moist.  Neck: normal, supple Respiratory: Basilar crackles, slightly increased respiratory effort, tachypneic at times Cardiovascular: Regular rate and rhythm, no murmurs / rubs / gallops. Trace LE edema.  Abdomen: non distended, no tenderness. Bowel sounds positive.  Musculoskeletal: no clubbing / cyanosis.    Data Reviewed: I have independently reviewed following labs and imaging studies   CBC Recent Labs  Lab 03/04/24 0954  WBC 7.3  HGB 10.4*  HCT 34.1*  PLT 130*  MCV 106.9*  MCH 32.6  MCHC 30.5  RDW 17.8*  LYMPHSABS 0.8  MONOABS 0.5  EOSABS 0.1  BASOSABS 0.0    Recent Labs  Lab 03/04/24 0954 03/05/24 0359  NA 139 141  K 4.1 4.0  CL 104 107  CO2 22 23  GLUCOSE 253* 127*  BUN 70* 73*  CREATININE 3.25* 3.45*  CALCIUM 8.7* 8.2*  AST 23  --  ALT 27  --   ALKPHOS 175*  --   BILITOT 0.7  --   ALBUMIN 3.9  --   HGBA1C  --  6.8*    ------------------------------------------------------------------------------------------------------------------ No results for input(s): CHOL, HDL, LDLCALC, TRIG, CHOLHDL, LDLDIRECT in the last 72 hours.  Lab Results  Component Value Date   HGBA1C 6.8 (H) 03/05/2024   ------------------------------------------------------------------------------------------------------------------ No results for input(s): TSH, T4TOTAL, T3FREE, THYROIDAB in the last 72 hours.  Invalid input(s): FREET3  Cardiac Enzymes No results for input(s): CKMB, TROPONINI, MYOGLOBIN in the last 168 hours.  Invalid input(s): CK ------------------------------------------------------------------------------------------------------------------ No results found for: BNP  CBG: Recent Labs  Lab 03/05/24 0714 03/05/24 1145 03/05/24 1632 03/05/24 2040 03/06/24 0721  GLUCAP 121* 257* 175* 153* 144*    No results found for this or any previous visit (from the past 240 hours).   Radiology Studies: ECHOCARDIOGRAM  COMPLETE Result Date: 03/05/2024    ECHOCARDIOGRAM REPORT   Patient Name:   KENO CARAWAY Date of Exam: 03/05/2024 Medical Rec #:  [id number]    Height:       68.0 in Accession #:    [id number]   Weight:       200.4 lb Date of Birth:  Sep 19, 1945    BSA:          2.045 m Patient Age:    78 years     BP:           110/63 mmHg Patient Gender: M            HR:           66 bpm. Exam Location:  [facility] Procedure: 2D Echo, Cardiac Doppler and Color Doppler (Both Spectral and Color            Flow Doppler were utilized during procedure). Indications:    I50.40* Unspecified combined systolic (congestive) and diastolic                 (congestive) heart failure  History:        Patient has no prior history of Echocardiogram examinations.                 CHF, CAD, Abnormal ECG and Defibrillator; Arrythmias:Atrial                 Fibrillation. Definity hypersensitivity. Prior echo done at                 another hospital per family.  Sonographer:    Ellouise Mose RDCS Referring Phys: [id number] [facility]  Sonographer Comments: Definity not done due to prevoius hypersensitivity reaction of back pain. IMPRESSIONS  1. Left ventricular ejection fraction, by estimation, is 35 to 40%. The left ventricle has moderately decreased function. The left ventricle demonstrates global hypokinesis. The left ventricular internal cavity size was mildly dilated. There is mild left ventricular hypertrophy. Left ventricular diastolic parameters are consistent with Grade II diastolic dysfunction (pseudonormalization). Elevated left atrial pressure. There is the interventricular septum is flattened in systole and diastole, consistent with right ventricular pressure and volume overload.  2. Rv not well visualized. Grossly appears severely enlarged with severely decreased systolic function. . Right ventricular systolic function was not well visualized. The right ventricular size is not well visualized. There is mildly elevated pulmonary artery  systolic pressure. The estimated right ventricular systolic pressure is 40.8 mmHg.  3. Left atrial size was mildly dilated.  4. Large pleural effusion.  5. The mitral valve is abnormal. Mild  mitral valve regurgitation. No evidence of mitral stenosis.  6. The tricuspid valve is abnormal.  7. The aortic valve is tricuspid. There is mild calcification of the aortic valve. There is mild thickening of the aortic valve. Aortic valve regurgitation is not visualized. No aortic stenosis is present.  8. Aortic dilatation noted. There is mild dilatation of the aortic root, measuring 40 mm. There is mild dilatation of the ascending aorta, measuring 43 mm.  9. The inferior vena cava is dilated in size with <50% respiratory variability, suggesting right atrial pressure of 15 mmHg. FINDINGS  Left Ventricle: Left ventricular ejection fraction, by estimation, is 35 to 40%. The left ventricle has moderately decreased function. The left ventricle demonstrates global hypokinesis. The left ventricular internal cavity size was mildly dilated. There is mild left ventricular hypertrophy. The interventricular septum is flattened in systole and diastole, consistent with right ventricular pressure and volume overload. Left ventricular diastolic parameters are consistent with Grade II diastolic dysfunction (pseudonormalization). Elevated left atrial pressure. Right Ventricle: Rv not well visualized. Grossly appears severely enlarged with severely decreased systolic function. The right ventricular size is not well visualized. Right vetricular wall thickness was not well visualized. Right ventricular systolic function was not well visualized. There is mildly elevated pulmonary artery systolic pressure. The tricuspid regurgitant velocity is 2.54 m/s, and with an assumed right atrial pressure of 15 mmHg, the estimated right ventricular systolic pressure is 40.8  mmHg. Left Atrium: Left atrial size was mildly dilated. Right Atrium: Right atrial  size was normal in size. Pericardium: There is no evidence of pericardial effusion. Mitral Valve: The mitral valve is abnormal. Mild mitral valve regurgitation. No evidence of mitral valve stenosis. Tricuspid Valve: The tricuspid valve is abnormal. Tricuspid valve regurgitation is mild . No evidence of tricuspid stenosis. Aortic Valve: The aortic valve is tricuspid. There is mild calcification of the aortic valve. There is mild thickening of the aortic valve. There is mild aortic valve annular calcification. Aortic valve regurgitation is not visualized. No aortic stenosis  is present. Aortic valve mean gradient measures 3.3 mmHg. Aortic valve peak gradient measures 6.2 mmHg. Aortic valve area, by VTI measures 3.95 cm. Pulmonic Valve: The pulmonic valve was not well visualized. Pulmonic valve regurgitation is not visualized. No evidence of pulmonic stenosis. Aorta: Aortic dilatation noted. There is mild dilatation of the aortic root, measuring 40 mm. There is mild dilatation of the ascending aorta, measuring 43 mm. Venous: The inferior vena cava is dilated in size with less than 50% respiratory variability, suggesting right atrial pressure of 15 mmHg. IAS/Shunts: No atrial level shunt detected by color flow Doppler. Additional Comments: A device lead is visualized in the right atrium and right ventricle. There is a large pleural effusion. Moderate ascites is present.  LEFT VENTRICLE PLAX 2D LVIDd:         6.20 cm      Diastology LVIDs:         5.20 cm      LV e' medial:    3.05 cm/s LV PW:         1.30 cm      LV E/e' medial:  37.7 LV IVS:        1.20 cm      LV e' lateral:   10.90 cm/s LVOT diam:     2.50 cm      LV E/e' lateral: 10.6 LV SV:         115 LV SV Index:   56 LVOT Area:  4.91 cm  LV Volumes (MOD) LV vol d, MOD A2C: 143.5 ml LV vol d, MOD A4C: 185.0 ml LV vol s, MOD A2C: 109.0 ml LV vol s, MOD A4C: 102.9 ml LV SV MOD A2C:     34.5 ml LV SV MOD A4C:     185.0 ml LV SV MOD BP:      56.7 ml RIGHT  VENTRICLE            IVC RV S prime:     4.24 cm/s  IVC diam: 3.20 cm TAPSE (M-mode): 0.6 cm LEFT ATRIUM             Index        RIGHT ATRIUM           Index LA diam:        5.90 cm 2.88 cm/m   RA Area:     21.70 cm LA Vol (A2C):   77.1 ml 37.69 ml/m  RA Volume:   62.50 ml  30.56 ml/m LA Vol (A4C):   65.9 ml 32.22 ml/m LA Biplane Vol: 74.0 ml 36.18 ml/m  AORTIC VALVE AV Area (Vmax):    4.23 cm AV Area (Vmean):   3.97 cm AV Area (VTI):     3.95 cm AV Vmax:           124.23 cm/s AV Vmean:          84.223 cm/s AV VTI:            0.291 m AV Peak Grad:      6.2 mmHg AV Mean Grad:      3.3 mmHg LVOT Vmax:         107.00 cm/s LVOT Vmean:        68.100 cm/s LVOT VTI:          0.234 m LVOT/AV VTI ratio: 0.81  AORTA Ao Root diam: 4.00 cm Ao Asc diam:  4.35 cm MITRAL VALVE                TRICUSPID VALVE MV Area (PHT): 3.99 cm     TR Peak grad:   25.8 mmHg MV Decel Time: 190 msec     TR Vmax:        254.00 cm/s MV E velocity: 115.00 cm/s MV A velocity: 65.50 cm/s   SHUNTS MV E/A ratio:  1.76         Systemic VTI:  0.23 m                             Systemic Diam: 2.50 cm Dorn Ross MD Electronically signed by Dorn Ross MD Signature Date/Time: 03/05/2024/12:34:00 PM    Final      Nilda Fendt, MD, PhD [facility]  Between 7 am - 7 pm I am available, please contact me via Amion (for emergencies) or Securechat (non urgent messages)  Between 7 pm - 7 am I am not available, please contact night coverage MD/APP via Amion

## 2024-03-07 DIAGNOSIS — I5023 Acute on chronic systolic (congestive) heart failure: Secondary | ICD-10-CM | POA: Diagnosis not present

## 2024-03-07 DIAGNOSIS — Z7901 Long term (current) use of anticoagulants: Secondary | ICD-10-CM | POA: Diagnosis not present

## 2024-03-07 DIAGNOSIS — I5043 Acute on chronic combined systolic (congestive) and diastolic (congestive) heart failure: Secondary | ICD-10-CM | POA: Diagnosis not present

## 2024-03-07 DIAGNOSIS — I5082 Biventricular heart failure: Secondary | ICD-10-CM

## 2024-03-07 DIAGNOSIS — I480 Paroxysmal atrial fibrillation: Secondary | ICD-10-CM | POA: Diagnosis not present

## 2024-03-07 DIAGNOSIS — I2510 Atherosclerotic heart disease of native coronary artery without angina pectoris: Secondary | ICD-10-CM

## 2024-03-07 LAB — COMPREHENSIVE METABOLIC PANEL WITH GFR
ALT: 23 U/L (ref 0–44)
AST: 17 U/L (ref 15–41)
Albumin: 3.8 g/dL (ref 3.5–5.0)
Alkaline Phosphatase: 119 U/L (ref 38–126)
Anion gap: 10 (ref 5–15)
BUN: 79 mg/dL — ABNORMAL HIGH (ref 8–23)
CO2: 26 mmol/L (ref 22–32)
Calcium: 8.8 mg/dL — ABNORMAL LOW (ref 8.9–10.3)
Chloride: 102 mmol/L (ref 98–111)
Creatinine, Ser: 3.04 mg/dL — ABNORMAL HIGH (ref 0.61–1.24)
GFR, Estimated: 20 mL/min — ABNORMAL LOW (ref >60)
Glucose, Bld: 138 mg/dL — ABNORMAL HIGH (ref 70–99)
Potassium: 4.2 mmol/L (ref 3.5–5.1)
Sodium: 137 mmol/L (ref 135–145)
Total Bilirubin: 0.8 mg/dL (ref 0.0–1.2)
Total Protein: 5.8 g/dL — ABNORMAL LOW (ref 6.5–8.1)

## 2024-03-07 LAB — CBC
HCT: 27.5 % — ABNORMAL LOW (ref 39.0–52.0)
Hemoglobin: 8.5 g/dL — ABNORMAL LOW (ref 13.0–17.0)
MCH: 32.7 pg (ref 26.0–34.0)
MCHC: 30.9 g/dL (ref 30.0–36.0)
MCV: 105.8 fL — ABNORMAL HIGH (ref 80.0–100.0)
Platelets: 116 K/uL — ABNORMAL LOW (ref 150–400)
RBC: 2.6 MIL/uL — ABNORMAL LOW (ref 4.22–5.81)
RDW: 17.1 % — ABNORMAL HIGH (ref 11.5–15.5)
WBC: 6.3 K/uL (ref 4.0–10.5)
nRBC: 0 % (ref 0.0–0.2)

## 2024-03-07 LAB — BASIC METABOLIC PANEL WITH GFR
Anion gap: 12 (ref 5–15)
BUN: 76 mg/dL — ABNORMAL HIGH (ref 8–23)
CO2: 26 mmol/L (ref 22–32)
Calcium: 9 mg/dL (ref 8.9–10.3)
Chloride: 101 mmol/L (ref 98–111)
Creatinine, Ser: 3.24 mg/dL — ABNORMAL HIGH (ref 0.61–1.24)
GFR, Estimated: 19 mL/min — ABNORMAL LOW (ref >60)
Glucose, Bld: 189 mg/dL — ABNORMAL HIGH (ref 70–99)
Potassium: 4.3 mmol/L (ref 3.5–5.1)
Sodium: 139 mmol/L (ref 135–145)

## 2024-03-07 LAB — GLUCOSE, CAPILLARY
Glucose-Capillary: 126 mg/dL — ABNORMAL HIGH (ref 70–99)
Glucose-Capillary: 182 mg/dL — ABNORMAL HIGH (ref 70–99)
Glucose-Capillary: 160 mg/dL — ABNORMAL HIGH (ref 70–99)
Glucose-Capillary: 181 mg/dL — ABNORMAL HIGH (ref 70–99)

## 2024-03-07 LAB — MAGNESIUM: Magnesium: 2.5 mg/dL — ABNORMAL HIGH (ref 1.7–2.4)

## 2024-03-07 NOTE — Progress Notes (Signed)
 Progress Note  Patient Name: Tom Bartlett Date of Encounter: 03/07/2024  Primary Cardiologist: None  Subjective   SOB improved at rest.  Still has orthopnea and PND.  Inpatient Medications    Scheduled Meds:  insulin aspart  0-5 Units Subcutaneous QHS   insulin aspart  0-6 Units Subcutaneous TID WC   Rivaroxaban  15 mg Oral Daily   senna-docusate  2 tablet Oral QHS   sodium bicarbonate  650 mg Oral Daily   sodium chloride  flush  3 mL Intravenous Q12H   sodium chloride  flush  3 mL Intravenous Q12H   spironolactone  25 mg Oral Daily   cyanocobalamin  100 mcg Oral Daily   Continuous Infusions:  furosemide (LASIX) 200 mg in dextrose 5 % 100 mL (2 mg/mL) infusion 15 mg/hr (03/07/24 0129)   PRN Meds: acetaminophen **OR** acetaminophen, bisacodyl, ondansetron **OR** ondansetron (ZOFRAN) IV, polyethylene glycol, sodium chloride  flush, traZODone   Vital Signs    Vitals:   03/06/24 2018 03/07/24 0429 03/07/24 1019 03/07/24 1042  BP: 121/63 (!) 121/56 (!) 118/58   Pulse: 64 67 (!) 59   Resp: 16 16 20    Temp: (!) 97.5 F (36.4 C) 97.7 F (36.5 C) 97.7 F (36.5 C)   TempSrc: Oral Oral Oral   SpO2: 91% 93% 95%   Weight:  90 kg  88.8 kg  Height:        Intake/Output Summary (Last 24 hours) at 03/07/2024 1105 Last data filed at 03/07/2024 1059 Gross per 24 hour  Intake 228.54 ml  Output 2775 ml  Net -2546.46 ml   Filed Weights   03/06/24 0411 03/07/24 0429 03/07/24 1042  Weight: 91.8 kg 90 kg 88.8 kg    Telemetry     Personally reviewed.  A-paced rhythm  ECG    Not performed today.  Physical Exam   GEN: No acute distress.   Neck: JVD+. Cardiac: RRR, no murmur, rub, or gallop.  Respiratory: Nonlabored.  Minimal bibasilar rales present GI: Soft, nontender, bowel sounds present. MS: 1+ pitting edema in bilateral extremities, right above the knees; No deformity. Neuro:  Nonfocal. Psych: Alert and oriented x 3. Normal affect.  Labs    Chemistry Recent  Labs  Lab 03/04/24 0954 03/05/24 0359 03/06/24 1012 03/07/24 0332  NA 139 141 137 137  K 4.1 4.0 4.6 4.2  CL 104 107 103 102  CO2 22 23 26 26   GLUCOSE 253* 127* 198* 138*  BUN 70* 73* 74* 79*  CREATININE 3.25* 3.45* 3.08* 3.04*  CALCIUM 8.7* 8.2* 8.5* 8.8*  PROT 6.2*  --   --  5.8*  ALBUMIN 3.9  --   --  3.8  AST 23  --   --  17  ALT 27  --   --  23  ALKPHOS 175*  --   --  119  BILITOT 0.7  --   --  0.8  GFRNONAA 19* 17* 20* 20*  ANIONGAP 12 11 9 10      Hematology Recent Labs  Lab 03/04/24 0954 03/07/24 0332  WBC 7.3 6.3  RBC 3.19* 2.60*  HGB 10.4* 8.5*  HCT 34.1* 27.5*  MCV 106.9* 105.8*  MCH 32.6 32.7  MCHC 30.5 30.9  RDW 17.8* 17.1*  PLT 130* 116*    Cardiac EnzymesNo results for input(s): TROPONINIHS in the last 720 hours.  BNP Recent Labs  Lab 03/04/24 0954  PROBNP 19,477.0*     DDimerNo results for input(s): DDIMER in the last 168 hours.  Radiology    No results found.   Assessment & Plan   Acute on chronic systolic and diastolic HF Biventricular heart failure S/p Medtronic dual chamber ICD CKD III - Presented with DOE, orthopnea, PND, abdominal distention and leg swelling x 3 to 4 weeks. 20 lb weight gain. Compliant to diet and fluid. Had gout flares in the last couple of months treated with steroids. - JVD+, bibasilar rales, abd distention, pitting edema noted, improved. - Echo this admission showed LVEF 35-40%, G2DD, severe RV systolic dysfunction with severe RV enlargement, large pleural effusion, CVP 15 mm Hg. - 2.8 L urine output in the last 24 hours on Lasix drip at 50 mg/h however Lasix drip was started yesterday at 3:30 PM.  Received 1 dose of IV Lasix 80 mg in the AM. - Continue Lasix drip @ 15 mg/hour. Keep K>4 and <5, Mg>2 and <3 while on lasix drip. - Stopped Coreg   Large pleural effusion - Echo noted large pleural effusion yesterday on 03/05/24. CXR on admission noted small left pleural effusion. Will repeat CXR few days  after diuresis.   CAD s/p 4v CABG in 2017 - No angina. Volume overloaded. Continue cardioprotective meds. No need of ASA due to Xarelto use. Cath in Jan 2025 revealed patent grafts and medical Mx is recommended.   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   Time spent in encounter: 30 minutes in reviewing prior notes from care Everywhere, >3 labs, discussion of the above with the patient, wife, daughter at bedside, primary team and documentation.  Signed, Kathee Tumlin SHAUNNA Rayleen Wyrick, MD  03/07/2024, 11:05 AM

## 2024-03-07 NOTE — Progress Notes (Signed)
 Heart Failure Nurse Navigator Progress Note  PCP: Gerome Tillman CROME, FNP Cardiology: Mearl Gene MD Electrophysiologist: Hildegard Rinks, MD-[facility] Admission Diagnosis: Systolic congestive heart failure, unspecified HF chronicity [facility]) Admitted from: Home  [facility]: Called pt remotely @ [facility] room 302.  2 patient identifiers confirmed prior to providing him with CHF education.  Both his wife Ricka and daughter Charlies joined in on the phone conversation as well.   Presentation:   Tom Bartlett is a 78 y.o. male. He presented to the ED with shortness of breath on exertion and at rest. A 17-20 pound weight gain and increased swelling in his legs.  Patient with a history of stroke in 2012 and 2019, acute systolic dysfunction CHF, CAD with prior CABG in 2017, prior left lower extremity DVT and PE,  moderate pulmonary hypertension, CKD-4, on chronic anticoagulation with Xarelto.  Patient has PAFib and sick sinus syndrome with an AICD.  Pro-BNP 19,477. CRT 3.25 Chest x-ray: small left-sided pleural effusion and cardiomegaly.   ECHO/ LVEF: 35-40% Grade II diastolic dysfunction (pseudonormalization)  Clinical Course:  Past Medical History:  Diagnosis Date   AICD (automatic cardioverter/defibrillator) present    Anemia    CAD (coronary artery disease)    Chronic systolic heart failure (HCC)    Chronic venous insufficiency    CKD (chronic kidney disease)    Stage 3   COPD (chronic obstructive pulmonary disease) (HCC)    Diabetes (HCC)    DVT (deep venous thrombosis) (HCC) 1979   Left Leg   Dyslipidemia    Hypertension    Kidney stones    Myocardial infarction (HCC) 05/18/2005   Obstructive sleep apnea    PAF (paroxysmal atrial fibrillation) (HCC)    Peripheral artery disease    Prostate cancer (HCC)    Pulmonary embolism (HCC) 2003   Sick sinus syndrome (HCC)    Stroke (HCC)    2012, 2019  Some right arm numbness     Social History   Socioeconomic History   Marital status:  Married    Spouse name: Not on file   Number of children: Not on file   Years of education: Not on file   Highest education level: Not on file  Occupational History   Not on file  Tobacco Use   Smoking status: Former    Types: Cigarettes   Smokeless tobacco: Never   Tobacco comments:    Quit over 40 yrs ago  Vaping Use   Vaping status: Never Used  Substance and Sexual Activity   Alcohol use: Not Currently    Comment: quit age 27   Drug use: Never   Sexual activity: Not on file  Other Topics Concern   Not on file  Social History Narrative   Not on file   Social Drivers of Health   Financial Resource Strain: Low Risk  (03/01/2023)   Received from [facility]   Overall Financial Resource Strain (CARDIA)    Difficulty of Paying Living Expenses: Not hard at all  Food Insecurity: No Food Insecurity (03/04/2024)   Hunger Vital Sign    Worried About Running Out of Food in the Last Year: Never true    Ran Out of Food in the Last Year: Never true  Transportation Needs: No Transportation Needs (03/04/2024)   PRAPARE - Administrator, Civil Service (Medical): No    Lack of Transportation (Non-Medical): No  Physical Activity: Not on file  Stress: Not on file  Social  Connections: Moderately Integrated (03/04/2024)   Social Connection and Isolation Panel    Frequency of Communication with Friends and Family: Three times a week    Frequency of Social Gatherings with Friends and Family: Three times a week    Attends Religious Services: 1 to 4 times per year    Active Member of Clubs or Organizations: No    Attends Banker Meetings: Never    Marital Status: Married   Water engineer and Provision:  Detailed education and instructions provided on heart failure disease management including the following:  Signs and symptoms of Heart Failure When to call the physician Importance of daily weights Low sodium diet Fluid  restriction Medication management Anticipated future follow-up appointments  Patient education given on each of the above topics.  Patient acknowledges understanding via teach back method and acceptance of all instructions.  Education Materials:  Living Better With Heart Failure Booklet, HF zone tool, & Daily Weight Tracker Tool.  Patient has scale at home: Yes. He is not currently performing daily weights at home.  Patient has pill box at home: Yes    High Risk Criteria for Readmission and/or Poor Patient Outcomes: Heart failure hospital admissions (last 6 months): 1  No Show rate: 0% Difficult social situation: None Demonstrates medication adherence: Yes Primary Language: English Literacy level: Reading, Writing & Comprehension  Barriers of Care:   None  Considerations/Referrals:  Referral made to Heart Failure Pharmacist Stewardship: Yes Referral made to Heart Failure CSW/NCM TOC: No Referral made to Heart & Vascular TOC clinic: Yes.  Patient will follow-up with the [facility] following [facility] from [facility] 03/15/04 @ 9:00   Items for Follow-up on DC/TOC: Daily Weights-Renee is going to order a scale for patient Diet & Fluid Restrictions-currently on low sodium diet and 50oz fluid restriction by his nephrologist. Continued Heart Failure Education  Charmaine Pines, RN, BSN [facility] Heart Failure Navigator Secure Chat Only

## 2024-03-07 NOTE — Plan of Care (Signed)

## 2024-03-07 NOTE — Progress Notes (Signed)
 PROGRESS NOTE    Tom Bartlett  FMW:[id number] DOB: [birth year] DOA: 03/04/2024 PCP: Gerome Tillman CROME, FNP    Brief Narrative:  78 year old with history of CKD stage IV, baseline creatinine 2, chronic combined heart failure, pulmonary hypertension, 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  Compliant with home medications.  Admitted with cardiology consultation.  Subjective: Patient seen and examined.  He thinks last 24 hours has been some improvement.  Urine output 2.2 L / 24 hours.  Negative balance 4.4 L since admission.  Currently on Lasix drip.  Denies any chest pain.  Able to move around the bed better today. Assessment & Plan:   Acute on chronic combined congestive heart failure: Acute hypoxemic respiratory failure secondary to CHF exacerbation, keep on oxygen to keep saturation more than 90%. Patient send hypertension Ischemic cardiomyopathy status post CABG in 2017, paroxysmal A-fib sick sinus syndrome status post AICD.  Presented with 20 pound weight gain, orthopnea, dyspnea on exertion.  Echocardiogram with EF 35 to 40%, grade 2 diastolic dysfunction, severely enlarged RV.  Did not respond well to IV Lasix. Now remains on Lasix infusion 15 mg/h with good clinical response.  Kidney functions stable.  Currently unable to tolerate any GDMT due to abnormal kidney functions and blood pressure.  Beta-blockers on hold.  Cardiology following. Very small pleural effusion. AICD functioning well. Rate controlled.  Coreg on hold. Therapeutic on Xarelto-adjusted to renal function. Blood pressure stable.  AKI on CKD stage IV: Baseline creatinine about 2.3-2.5.  Creatinine elevated with intermittent Lasix.  Today stabilizing.  Needs very close monitoring.  Chronic anemia thrombocytopenia: Chronic and stable.    DVT  prophylaxis: SCDs Start: 03/04/24 1237 Place TED hose Start: 03/04/24 1237 Rivaroxaban (XARELTO) tablet 15 mg   Code Status: Full code Family Communication: None at bedside Disposition Plan: Status is: Inpatient Remains inpatient appropriate because: IV diuresis     Consultants:  Cardiology  Procedures:  None  Antimicrobials:  None     Objective: Vitals:   03/06/24 1413 03/06/24 2018 03/07/24 0429 03/07/24 1019  BP: (!) 109/57 121/63 (!) 121/56 (!) 118/58  Pulse: 65 64 67 (!) 59  Resp: 16 16 16 20   Temp: 97.9 F (36.6 C) (!) 97.5 F (36.4 C) 97.7 F (36.5 C) 97.7 F (36.5 C)  TempSrc: Oral Oral Oral Oral  SpO2: 94% 91% 93% 95%  Weight:   90 kg   Height:        Intake/Output Summary (Last 24 hours) at 03/07/2024 1037 Last data filed at 03/07/2024 0600 Gross per 24 hour  Intake 228.54 ml  Output 2475 ml  Net -2246.46 ml   Filed Weights   03/05/24 0343 03/06/24 0411 03/07/24 0429  Weight: 90.9 kg 91.8 kg 90 kg    Examination:  General exam: Appears calm and comfortable.  Pleasant interactive.  In mild distress while talking long sentences. Respiratory system: Poor air entry at both bases.   SpO2: 95 % O2 Flow Rate (L/min): 2 L/min  Cardiovascular system: S1 & S2 heard, RRR.  AICD in place.  Wearing compression socks.  No edema.  Visible pulsatile JVD.  Gastrointestinal system: Abdomen is nondistended, soft and nontender. No organomegaly or masses felt. Normal bowel sounds heard. Central nervous system: Alert and oriented. No focal neurological deficits. Extremities: Symmetric 5 x 5 power. Skin: No rashes, lesions or ulcers  Psychiatry: Judgement and insight appear normal. Mood & affect appropriate.     Data Reviewed: I have personally reviewed following labs and imaging studies  CBC: Recent Labs  Lab 03/04/24 0954 03/07/24 0332  WBC 7.3 6.3  NEUTROABS 5.8  --   HGB 10.4* 8.5*  HCT 34.1* 27.5*  MCV 106.9* 105.8*  PLT 130* 116*   Basic  Metabolic Panel: Recent Labs  Lab 03/04/24 0954 03/05/24 0359 03/06/24 1012 03/07/24 0332  NA 139 141 137 137  K 4.1 4.0 4.6 4.2  CL 104 107 103 102  CO2 22 23 26 26   GLUCOSE 253* 127* 198* 138*  BUN 70* 73* 74* 79*  CREATININE 3.25* 3.45* 3.08* 3.04*  CALCIUM 8.7* 8.2* 8.5* 8.8*  MG  --   --   --  2.5*   GFR: Estimated Creatinine Clearance: 21.5 mL/min (A) (by C-G formula based on SCr of 3.04 mg/dL (H)). Liver Function Tests: Recent Labs  Lab 03/04/24 0954 03/07/24 0332  AST 23 17  ALT 27 23  ALKPHOS 175* 119  BILITOT 0.7 0.8  PROT 6.2* 5.8*  ALBUMIN 3.9 3.8   No results for input(s): LIPASE, AMYLASE in the last 168 hours. No results for input(s): AMMONIA in the last 168 hours. Coagulation Profile: No results for input(s): INR, PROTIME in the last 168 hours. Cardiac Enzymes: No results for input(s): CKTOTAL, CKMB, CKMBINDEX, TROPONINI in the last 168 hours. BNP (last 3 results) Recent Labs    03/04/24 0954  PROBNP 19,477.0*   HbA1C: Recent Labs    03/05/24 0359  HGBA1C 6.8*   CBG: Recent Labs  Lab 03/06/24 0721 03/06/24 1215 03/06/24 1602 03/06/24 2115 03/07/24 0741  GLUCAP 144* 184* 159* 198* 126*   Lipid Profile: No results for input(s): CHOL, HDL, LDLCALC, TRIG, CHOLHDL, LDLDIRECT in the last 72 hours. Thyroid Function Tests: No results for input(s): TSH, T4TOTAL, FREET4, T3FREE, THYROIDAB in the last 72 hours. Anemia Panel: No results for input(s): VITAMINB12, FOLATE, FERRITIN, TIBC, IRON, RETICCTPCT in the last 72 hours. Sepsis Labs: No results for input(s): PROCALCITON, LATICACIDVEN in the last 168 hours.  No results found for this or any previous visit (from the past 240 hours).       Radiology Studies: ECHOCARDIOGRAM COMPLETE Result Date: 03/05/2024    ECHOCARDIOGRAM REPORT   Patient Name:   Tom Bartlett Date of Exam: 03/05/2024 Medical Rec #:  [id number]    Height:       68.0  in Accession #:    [id number]   Weight:       200.4 lb Date of Birth:  01/24/1946    BSA:          2.045 m Patient Age:    78 years     BP:           110/63 mmHg Patient Gender: M            HR:           66 bpm. Exam Location:  Tom Bart[facility] Procedure: 2D Echo, Cardiac Doppler and Color Doppler (Both Spectral and Color            Flow Doppler were utilized during procedure). Indications:    I50.40* Unspecified combined systolic (congestive) and diastolic                 (congestive) heart failure  History:        Patient has no prior history of Echocardiogram examinations.  CHF, CAD, Abnormal ECG and Defibrillator; Arrythmias:Atrial                 Fibrillation. Definity hypersensitivity. Prior echo done at                 another hospital per family.  Sonographer:    Ellouise Mose RDCS Referring Phys: [id number] [facility]  Sonographer Comments: Definity not done due to prevoius hypersensitivity reaction of back pain. IMPRESSIONS  1. Left ventricular ejection fraction, by estimation, is 35 to 40%. The left ventricle has moderately decreased function. The left ventricle demonstrates global hypokinesis. The left ventricular internal cavity size was mildly dilated. There is mild left ventricular hypertrophy. Left ventricular diastolic parameters are consistent with Grade II diastolic dysfunction (pseudonormalization). Elevated left atrial pressure. There is the interventricular septum is flattened in systole and diastole, consistent with right ventricular pressure and volume overload.  2. Rv not well visualized. Grossly appears severely enlarged with severely decreased systolic function. . Right ventricular systolic function was not well visualized. The right ventricular size is not well visualized. There is mildly elevated pulmonary artery systolic pressure. The estimated right ventricular systolic pressure is 40.8 mmHg.  3. Left atrial size was mildly dilated.  4. Large pleural effusion.  5. The mitral  valve is abnormal. Mild mitral valve regurgitation. No evidence of mitral stenosis.  6. The tricuspid valve is abnormal.  7. The aortic valve is tricuspid. There is mild calcification of the aortic valve. There is mild thickening of the aortic valve. Aortic valve regurgitation is not visualized. No aortic stenosis is present.  8. Aortic dilatation noted. There is mild dilatation of the aortic root, measuring 40 mm. There is mild dilatation of the ascending aorta, measuring 43 mm.  9. The inferior vena cava is dilated in size with <50% respiratory variability, suggesting right atrial pressure of 15 mmHg. FINDINGS  Left Ventricle: Left ventricular ejection fraction, by estimation, is 35 to 40%. The left ventricle has moderately decreased function. The left ventricle demonstrates global hypokinesis. The left ventricular internal cavity size was mildly dilated. There is mild left ventricular hypertrophy. The interventricular septum is flattened in systole and diastole, consistent with right ventricular pressure and volume overload. Left ventricular diastolic parameters are consistent with Grade II diastolic dysfunction (pseudonormalization). Elevated left atrial pressure. Right Ventricle: Rv not well visualized. Grossly appears severely enlarged with severely decreased systolic function. The right ventricular size is not well visualized. Right vetricular wall thickness was not well visualized. Right ventricular systolic function was not well visualized. There is mildly elevated pulmonary artery systolic pressure. The tricuspid regurgitant velocity is 2.54 m/s, and with an assumed right atrial pressure of 15 mmHg, the estimated right ventricular systolic pressure is 40.8  mmHg. Left Atrium: Left atrial size was mildly dilated. Right Atrium: Right atrial size was normal in size. Pericardium: There is no evidence of pericardial effusion. Mitral Valve: The mitral valve is abnormal. Mild mitral valve regurgitation. No  evidence of mitral valve stenosis. Tricuspid Valve: The tricuspid valve is abnormal. Tricuspid valve regurgitation is mild . No evidence of tricuspid stenosis. Aortic Valve: The aortic valve is tricuspid. There is mild calcification of the aortic valve. There is mild thickening of the aortic valve. There is mild aortic valve annular calcification. Aortic valve regurgitation is not visualized. No aortic stenosis  is present. Aortic valve mean gradient measures 3.3 mmHg. Aortic valve peak gradient measures 6.2 mmHg. Aortic valve area, by VTI measures 3.95 cm. Pulmonic Valve: The pulmonic valve  was not well visualized. Pulmonic valve regurgitation is not visualized. No evidence of pulmonic stenosis. Aorta: Aortic dilatation noted. There is mild dilatation of the aortic root, measuring 40 mm. There is mild dilatation of the ascending aorta, measuring 43 mm. Venous: The inferior vena cava is dilated in size with less than 50% respiratory variability, suggesting right atrial pressure of 15 mmHg. IAS/Shunts: No atrial level shunt detected by color flow Doppler. Additional Comments: A device lead is visualized in the right atrium and right ventricle. There is a large pleural effusion. Moderate ascites is present.  LEFT VENTRICLE PLAX 2D LVIDd:         6.20 cm      Diastology LVIDs:         5.20 cm      LV e' medial:    3.05 cm/s LV PW:         1.30 cm      LV E/e' medial:  37.7 LV IVS:        1.20 cm      LV e' lateral:   10.90 cm/s LVOT diam:     2.50 cm      LV E/e' lateral: 10.6 LV SV:         115 LV SV Index:   56 LVOT Area:     4.91 cm  LV Volumes (MOD) LV vol d, MOD A2C: 143.5 ml LV vol d, MOD A4C: 185.0 ml LV vol s, MOD A2C: 109.0 ml LV vol s, MOD A4C: 102.9 ml LV SV MOD A2C:     34.5 ml LV SV MOD A4C:     185.0 ml LV SV MOD BP:      56.7 ml RIGHT VENTRICLE            IVC RV S prime:     4.24 cm/s  IVC diam: 3.20 cm TAPSE (M-mode): 0.6 cm LEFT ATRIUM             Index        RIGHT ATRIUM           Index LA diam:         5.90 cm 2.88 cm/m   RA Area:     21.70 cm LA Vol (A2C):   77.1 ml 37.69 ml/m  RA Volume:   62.50 ml  30.56 ml/m LA Vol (A4C):   65.9 ml 32.22 ml/m LA Biplane Vol: 74.0 ml 36.18 ml/m  AORTIC VALVE AV Area (Vmax):    4.23 cm AV Area (Vmean):   3.97 cm AV Area (VTI):     3.95 cm AV Vmax:           124.23 cm/s AV Vmean:          84.223 cm/s AV VTI:            0.291 m AV Peak Grad:      6.2 mmHg AV Mean Grad:      3.3 mmHg LVOT Vmax:         107.00 cm/s LVOT Vmean:        68.100 cm/s LVOT VTI:          0.234 m LVOT/AV VTI ratio: 0.81  AORTA Ao Root diam: 4.00 cm Ao Asc diam:  4.35 cm MITRAL VALVE                TRICUSPID VALVE MV Area (PHT): 3.99 cm     TR Peak grad:   25.8 mmHg MV Decel Time: 190  msec     TR Vmax:        254.00 cm/s MV E velocity: 115.00 cm/s MV A velocity: 65.50 cm/s   SHUNTS MV E/A ratio:  1.76         Systemic VTI:  0.23 m                             Systemic Diam: 2.50 cm Dorn Ross MD Electronically signed by Dorn Ross MD Signature Date/Time: 03/05/2024/12:34:00 PM    Final         Scheduled Meds:  insulin aspart  0-5 Units Subcutaneous QHS   insulin aspart  0-6 Units Subcutaneous TID WC   Rivaroxaban  15 mg Oral Daily   senna-docusate  2 tablet Oral QHS   sodium bicarbonate  650 mg Oral Daily   sodium chloride  flush  3 mL Intravenous Q12H   sodium chloride  flush  3 mL Intravenous Q12H   spironolactone  25 mg Oral Daily   cyanocobalamin  100 mcg Oral Daily   Continuous Infusions:  furosemide (LASIX) 200 mg in dextrose 5 % 100 mL (2 mg/mL) infusion 15 mg/hr (03/07/24 0129)     LOS: 2 days    Time spent: 52 minutes    Renato Applebaum, MD [facility]

## 2024-03-08 DIAGNOSIS — N184 Chronic kidney disease, stage 4 (severe): Secondary | ICD-10-CM | POA: Diagnosis not present

## 2024-03-08 DIAGNOSIS — Z7901 Long term (current) use of anticoagulants: Secondary | ICD-10-CM | POA: Diagnosis not present

## 2024-03-08 DIAGNOSIS — I5043 Acute on chronic combined systolic (congestive) and diastolic (congestive) heart failure: Secondary | ICD-10-CM | POA: Diagnosis not present

## 2024-03-08 DIAGNOSIS — I5082 Biventricular heart failure: Secondary | ICD-10-CM | POA: Diagnosis not present

## 2024-03-08 DIAGNOSIS — Z95810 Presence of automatic (implantable) cardiac defibrillator: Secondary | ICD-10-CM | POA: Diagnosis not present

## 2024-03-08 DIAGNOSIS — I480 Paroxysmal atrial fibrillation: Secondary | ICD-10-CM | POA: Diagnosis not present

## 2024-03-08 LAB — IRON AND TIBC
Iron: 36 ug/dL — ABNORMAL LOW (ref 45–182)
Saturation Ratios: 11 % — ABNORMAL LOW (ref 17.9–39.5)
TIBC: 316 ug/dL (ref 250–450)
UIBC: 280 ug/dL

## 2024-03-08 LAB — BASIC METABOLIC PANEL WITH GFR
Anion gap: 11 (ref 5–15)
Anion gap: 11 (ref 5–15)
BUN: 75 mg/dL — ABNORMAL HIGH (ref 8–23)
BUN: 74 mg/dL — ABNORMAL HIGH (ref 8–23)
CO2: 28 mmol/L (ref 22–32)
CO2: 30 mmol/L (ref 22–32)
Calcium: 9 mg/dL (ref 8.9–10.3)
Calcium: 9.1 mg/dL (ref 8.9–10.3)
Chloride: 101 mmol/L (ref 98–111)
Chloride: 97 mmol/L — ABNORMAL LOW (ref 98–111)
Creatinine, Ser: 3.12 mg/dL — ABNORMAL HIGH (ref 0.61–1.24)
Creatinine, Ser: 3.1 mg/dL — ABNORMAL HIGH (ref 0.61–1.24)
GFR, Estimated: 20 mL/min — ABNORMAL LOW (ref >60)
GFR, Estimated: 20 mL/min — ABNORMAL LOW (ref >60)
Glucose, Bld: 137 mg/dL — ABNORMAL HIGH (ref 70–99)
Glucose, Bld: 156 mg/dL — ABNORMAL HIGH (ref 70–99)
Potassium: 3.8 mmol/L (ref 3.5–5.1)
Potassium: 4.3 mmol/L (ref 3.5–5.1)
Sodium: 140 mmol/L (ref 135–145)
Sodium: 138 mmol/L (ref 135–145)

## 2024-03-08 LAB — CBC
HCT: 29.8 % — ABNORMAL LOW (ref 39.0–52.0)
Hemoglobin: 9.4 g/dL — ABNORMAL LOW (ref 13.0–17.0)
MCH: 33.1 pg (ref 26.0–34.0)
MCHC: 31.5 g/dL (ref 30.0–36.0)
MCV: 104.9 fL — ABNORMAL HIGH (ref 80.0–100.0)
Platelets: 131 K/uL — ABNORMAL LOW (ref 150–400)
RBC: 2.84 MIL/uL — ABNORMAL LOW (ref 4.22–5.81)
RDW: 17.1 % — ABNORMAL HIGH (ref 11.5–15.5)
WBC: 7 K/uL (ref 4.0–10.5)
nRBC: 0 % (ref 0.0–0.2)

## 2024-03-08 LAB — GLUCOSE, CAPILLARY
Glucose-Capillary: 135 mg/dL — ABNORMAL HIGH (ref 70–99)
Glucose-Capillary: 226 mg/dL — ABNORMAL HIGH (ref 70–99)
Glucose-Capillary: 125 mg/dL — ABNORMAL HIGH (ref 70–99)
Glucose-Capillary: 153 mg/dL — ABNORMAL HIGH (ref 70–99)

## 2024-03-08 LAB — FERRITIN: Ferritin: 125 ng/mL (ref 24–336)

## 2024-03-08 NOTE — Progress Notes (Addendum)
 Progress Note  Patient Name: Tom Bartlett Date of Encounter: 03/08/2024  Primary Cardiologist: None  Subjective   SOB improved at rest. Orthopnea and PND improved.  Inpatient Medications    Scheduled Meds:  insulin aspart  0-5 Units Subcutaneous QHS   insulin aspart  0-6 Units Subcutaneous TID WC   Rivaroxaban  15 mg Oral Daily   senna-docusate  2 tablet Oral QHS   sodium bicarbonate  650 mg Oral Daily   sodium chloride  flush  3 mL Intravenous Q12H   sodium chloride  flush  3 mL Intravenous Q12H   spironolactone  25 mg Oral Daily   cyanocobalamin  100 mcg Oral Daily   Continuous Infusions:  furosemide (LASIX) 200 mg in dextrose 5 % 100 mL (2 mg/mL) infusion 15 mg/hr (03/08/24 0432)   PRN Meds: acetaminophen **OR** acetaminophen, bisacodyl, ondansetron **OR** ondansetron (ZOFRAN) IV, polyethylene glycol, sodium chloride  flush, traZODone   Vital Signs    Vitals:   03/07/24 1317 03/07/24 2022 03/08/24 0434 03/08/24 0449  BP: 117/60 120/72 121/66   Pulse: (!) 59 (!) 59    Resp: 18 18 18    Temp: 98.4 F (36.9 C) 98.6 F (37 C) 98.2 F (36.8 C)   TempSrc: Oral Oral Oral   SpO2: 97% 95% 95%   Weight:    88.5 kg  Height:        Intake/Output Summary (Last 24 hours) at 03/08/2024 1014 Last data filed at 03/08/2024 0730 Gross per 24 hour  Intake --  Output 3825 ml  Net -3825 ml   Filed Weights   03/07/24 0429 03/07/24 1042 03/08/24 0449  Weight: 90 kg 88.8 kg 88.5 kg    Telemetry     Personally reviewed.  A-paced rhythm  ECG    Not performed today.  Physical Exam   GEN: No acute distress.   Neck: JVD+. Cardiac: RRR, no murmur, rub, or gallop.  Respiratory: Nonlabored.  Minimal bibasilar rales present GI: Soft, nontender, bowel sounds present. MS: 1+ pitting edema in bilateral extremities, right above the knees; No deformity. Neuro:  Nonfocal. Psych: Alert and oriented x 3. Normal affect.  Labs    Chemistry Recent Labs  Lab 03/04/24 0954  03/05/24 0359 03/07/24 0332 03/07/24 1912 03/08/24 0404  NA 139   < > 137 139 140  K 4.1   < > 4.2 4.3 3.8  CL 104   < > 102 101 101  CO2 22   < > 26 26 28   GLUCOSE 253*   < > 138* 189* 137*  BUN 70*   < > 79* 76* 75*  CREATININE 3.25*   < > 3.04* 3.24* 3.12*  CALCIUM 8.7*   < > 8.8* 9.0 9.0  PROT 6.2*  --  5.8*  --   --   ALBUMIN 3.9  --  3.8  --   --   AST 23  --  17  --   --   ALT 27  --  23  --   --   ALKPHOS 175*  --  119  --   --   BILITOT 0.7  --  0.8  --   --   GFRNONAA 19*   < > 20* 19* 20*  ANIONGAP 12   < > 10 12 11    < > = values in this interval not displayed.     Hematology Recent Labs  Lab 03/04/24 0954 03/07/24 0332  WBC 7.3 6.3  RBC 3.19* 2.60*  HGB 10.4*  8.5*  HCT 34.1* 27.5*  MCV 106.9* 105.8*  MCH 32.6 32.7  MCHC 30.5 30.9  RDW 17.8* 17.1*  PLT 130* 116*    Cardiac EnzymesNo results for input(s): TROPONINIHS in the last 720 hours.  BNP Recent Labs  Lab 03/04/24 0954  PROBNP 19,477.0*     DDimerNo results for input(s): DDIMER in the last 168 hours.   Radiology    No results found.   Assessment & Plan   Acute on chronic systolic and diastolic HF Biventricular heart failure S/p Medtronic dual chamber ICD CKD III - Presented with DOE, orthopnea, PND, abdominal distention and leg swelling x 3 to 4 weeks. 20 lb weight gain. Compliant to diet and fluid. Had gout flares in the last couple of months treated with steroids. - JVD+, bibasilar rales cleared now, abd distention, pitting edema noted, improved. - Echo this admission showed LVEF 35-40%, G2DD, severe RV systolic dysfunction with severe RV enlargement, large pleural effusion, CVP 15 mm Hg. - 3.8 L urine output in the last 24 hours on Lasix drip at 15 mg/h. Serum Cr stable. BMP in PM. - Continue Lasix drip @ 15 mg/hour. Keep K>4 and <5, Mg>2 and <3 while on lasix drip. - Stopped Coreg  Severe anemia - Hb dropped from 10 to 8 this a.m. - CBC in PM. If still low, GI consult -  Iron cpanel   Large pleural effusion - Echo noted large pleural effusion yesterday on 03/05/24. CXR on admission noted small left pleural effusion. Will repeat CXR tomorrow AM.   CAD s/p 4v CABG in 2017 - No angina. Volume overloaded. Continue cardioprotective meds. No need of ASA due to Xarelto use. Cath in Jan 2025 revealed patent grafts and medical Mx is recommended.   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   Time spent in encounter: 30 minutes in reviewing prior notes from care Everywhere, >3 labs, discussion of the above with the patient, wife, daughter at bedside, primary team and documentation.  Signed, Daevon Holdren P Roper Tolson, MD  03/08/2024, 10:14 AM

## 2024-03-08 NOTE — Plan of Care (Signed)
   Problem: Clinical Measurements: Goal: Ability to maintain clinical measurements within normal limits will improve Outcome: Progressing Goal: Respiratory complications will improve Outcome: Progressing Goal: Cardiovascular complication will be avoided Outcome: Progressing

## 2024-03-08 NOTE — Progress Notes (Signed)
 PROGRESS NOTE    Tom Bartlett  FMW:[id number] DOB: 03/27/1946 DOA: 03/04/2024 PCP: Gerome Tillman CROME, FNP    Brief Narrative:  78 year old with history of CKD stage IV, baseline creatinine 2, chronic combined heart failure, pulmonary hypertension, 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  Compliant with home medications.  Admitted with cardiology consultation.  Subjective: No chest pain, no nausea or vomiting.  Reports breathing easier.  Good urine output appreciated.  Renal function stable.  Still requiring oxygen supplementation.  Assessment & Plan: Acute on chronic combined congestive heart failure: -Acute hypoxemic respiratory failure secondary to CHF exacerbation, keep on -Oxygen to keep saturation more than 90%. -Ischemic cardiomyopathy status post CABG in 2017, paroxysmal A-fib sick sinus syndrome status post AICD. -Presented with 20 pound weight gain, orthopnea, dyspnea on exertion.  -Echocardiogram with EF 35 to 40%, grade 2 diastolic dysfunction, severely enlarged RV.  Did not respond well to IV Lasix. -Now remains on Lasix infusion 15 mg/h with good clinical response.  Kidney functions stable.   -Currently unable to tolerate any GDMT due to abnormal kidney functions and blood pressure.   -Beta-blockers on hold.   -Cardiology following; will follow recommendations. -AICD functioning well. -Rate controlled.  Coreg on hold. - Repeat chest x-ray in a.m. to follow pleural effusion and stability.  Paroxysmal atrial fibrillation -Therapeutic on Xarelto-adjusted to renal function. -Blood pressure stable. - Holding Coreg  AKI on CKD stage IV: Baseline creatinine about 2.3-2.5.  - Today stabilizing.   - Continue to closely follow-up renal function trend - Continue IV diuresis.   - Patient advised to maintain adequate  oral hydration.  Chronic anemia thrombocytopenia:  -Chronic and stable. -Continue to follow trend intermittently.    DVT prophylaxis: SCDs Start: 03/04/24 1237 Place TED hose Start: 03/04/24 1237 Rivaroxaban (XARELTO) tablet 15 mg   Code Status: Full code Family Communication: None at bedside Disposition Plan: Status is: Inpatient Remains inpatient appropriate because: IV diuresis     Consultants:  Cardiology  Procedures:  None  Antimicrobials:  None  Objective: Vitals:   03/08/24 0434 03/08/24 0449 03/08/24 1200 03/08/24 1514  BP: 121/66   (!) 122/57  Pulse:    60  Resp: 18   18  Temp: 98.2 F (36.8 C)   98.1 F (36.7 C)  TempSrc: Oral   Oral  SpO2: 95%   95%  Weight:  88.5 kg 85.6 kg   Height:        Intake/Output Summary (Last 24 hours) at 03/08/2024 1528 Last data filed at 03/08/2024 1032 Gross per 24 hour  Intake --  Output 3350 ml  Net -3350 ml   Filed Weights   03/07/24 1042 03/08/24 0449 03/08/24 1200  Weight: 88.8 kg 88.5 kg 85.6 kg    Examination: General exam: Alert, awake, oriented x 3 and in no major distress; reports good urine output and expressed no chest pain. Respiratory system: Still requiring oxygen supplementation to maintain saturations; decreased breath sounds at the bases with fine crackles.  No using accessory muscles. Cardiovascular system: Rate controlled, no rubs, no gallops, no JVD on exam. Gastrointestinal system: Abdomen is nondistended, soft and nontender. No organomegaly or masses felt. Normal bowel sounds heard. Central nervous system:No focal neurological deficits. Extremities: No cyanosis or clubbing; positive trace to 1+ edema appreciated on exam. Skin: No petechiae. Psychiatry: Judgement and insight appear  normal. Mood & affect appropriate.   Data Reviewed: I have personally reviewed following labs and imaging studies  CBC: Recent Labs  Lab 03/04/24 0954 03/07/24 0332  WBC 7.3 6.3  NEUTROABS 5.8  --   HGB  10.4* 8.5*  HCT 34.1* 27.5*  MCV 106.9* 105.8*  PLT 130* 116*   Basic Metabolic Panel: Recent Labs  Lab 03/05/24 0359 03/06/24 1012 03/07/24 0332 03/07/24 1912 03/08/24 0404  NA 141 137 137 139 140  K 4.0 4.6 4.2 4.3 3.8  CL 107 103 102 101 101  CO2 23 26 26 26 28   GLUCOSE 127* 198* 138* 189* 137*  BUN 73* 74* 79* 76* 75*  CREATININE 3.45* 3.08* 3.04* 3.24* 3.12*  CALCIUM 8.2* 8.5* 8.8* 9.0 9.0  MG  --   --  2.5*  --   --    GFR: Estimated Creatinine Clearance: 20.4 mL/min (A) (by C-G formula based on SCr of 3.12 mg/dL (H)).  Liver Function Tests: Recent Labs  Lab 03/04/24 0954 03/07/24 0332  AST 23 17  ALT 27 23  ALKPHOS 175* 119  BILITOT 0.7 0.8  PROT 6.2* 5.8*  ALBUMIN 3.9 3.8   BNP (last 3 results) Recent Labs    03/04/24 0954  PROBNP 19,477.0*   HbA1C: No results for input(s): HGBA1C in the last 72 hours.  CBG: Recent Labs  Lab 03/07/24 1138 03/07/24 1616 03/07/24 2107 03/08/24 0729 03/08/24 1200  GLUCAP 182* 160* 181* 135* 226*   Anemia Panel: Recent Labs    03/08/24 1053  FERRITIN 125  TIBC 316  IRON 36*    Radiology Studies: No results found.  Scheduled Meds:  insulin aspart  0-5 Units Subcutaneous QHS   insulin aspart  0-6 Units Subcutaneous TID WC   Rivaroxaban  15 mg Oral Daily   senna-docusate  2 tablet Oral QHS   sodium bicarbonate  650 mg Oral Daily   sodium chloride  flush  3 mL Intravenous Q12H   sodium chloride  flush  3 mL Intravenous Q12H   spironolactone  25 mg Oral Daily   cyanocobalamin  100 mcg Oral Daily   Continuous Infusions:  furosemide (LASIX) 200 mg in dextrose 5 % 100 mL (2 mg/mL) infusion 15 mg/hr (03/08/24 0432)     LOS: 3 days    Time spent: 50 minutes    Eric Nunnery, MD [facility]

## 2024-03-09 ENCOUNTER — Inpatient Hospital Stay (HOSPITAL_COMMUNITY)

## 2024-03-09 DIAGNOSIS — I5043 Acute on chronic combined systolic (congestive) and diastolic (congestive) heart failure: Secondary | ICD-10-CM | POA: Diagnosis not present

## 2024-03-09 DIAGNOSIS — I480 Paroxysmal atrial fibrillation: Secondary | ICD-10-CM | POA: Diagnosis not present

## 2024-03-09 DIAGNOSIS — N184 Chronic kidney disease, stage 4 (severe): Secondary | ICD-10-CM | POA: Diagnosis not present

## 2024-03-09 DIAGNOSIS — Z95810 Presence of automatic (implantable) cardiac defibrillator: Secondary | ICD-10-CM | POA: Diagnosis not present

## 2024-03-09 DIAGNOSIS — J90 Pleural effusion, not elsewhere classified: Secondary | ICD-10-CM | POA: Diagnosis not present

## 2024-03-09 LAB — BASIC METABOLIC PANEL WITH GFR
Anion gap: 10 (ref 5–15)
BUN: 73 mg/dL — ABNORMAL HIGH (ref 8–23)
CO2: 30 mmol/L (ref 22–32)
Calcium: 8.9 mg/dL (ref 8.9–10.3)
Chloride: 99 mmol/L (ref 98–111)
Creatinine, Ser: 3.07 mg/dL — ABNORMAL HIGH (ref 0.61–1.24)
GFR, Estimated: 20 mL/min — ABNORMAL LOW (ref >60)
Glucose, Bld: 125 mg/dL — ABNORMAL HIGH (ref 70–99)
Potassium: 3.8 mmol/L (ref 3.5–5.1)
Sodium: 138 mmol/L (ref 135–145)

## 2024-03-09 LAB — GLUCOSE, CAPILLARY
Glucose-Capillary: 123 mg/dL — ABNORMAL HIGH (ref 70–99)
Glucose-Capillary: 247 mg/dL — ABNORMAL HIGH (ref 70–99)
Glucose-Capillary: 258 mg/dL — ABNORMAL HIGH (ref 70–99)
Glucose-Capillary: 112 mg/dL — ABNORMAL HIGH (ref 70–99)

## 2024-03-09 LAB — SOLUBLE TRANSFERRIN RECEPTOR: Transferrin Receptor: 26.9 nmol/L (ref 12.2–27.3)

## 2024-03-09 MED ORDER — FUROSEMIDE 10 MG/ML IJ SOLN
80.0000 mg | Freq: Two times a day (BID) | INTRAMUSCULAR | Status: AC
  Administered 2024-03-09: 80 mg via INTRAVENOUS
  Filled 2024-03-09: qty 8

## 2024-03-09 NOTE — Plan of Care (Signed)
  Problem: Clinical Measurements: Goal: Ability to maintain clinical measurements within normal limits will improve Outcome: Progressing Goal: Diagnostic test results will improve Outcome: Progressing   

## 2024-03-09 NOTE — Progress Notes (Signed)
 No acute events overnight. Patient's weight this AM  down to 83.1 kg. He had great urinary output overnight. Kellogg RN

## 2024-03-09 NOTE — Progress Notes (Signed)
 Progress Note  Patient Name: Tom Bartlett Date of Encounter: 03/09/2024  Primary Cardiologist: None  Subjective   SOB, orthopnea, PND resolved.  Abdominal distention improved.  He wants to go home.  Inpatient Medications    Scheduled Meds:  furosemide  80 mg Intravenous BID   insulin aspart  0-5 Units Subcutaneous QHS   insulin aspart  0-6 Units Subcutaneous TID WC   Rivaroxaban  15 mg Oral Daily   senna-docusate  2 tablet Oral QHS   sodium bicarbonate  650 mg Oral Daily   sodium chloride  flush  3 mL Intravenous Q12H   sodium chloride  flush  3 mL Intravenous Q12H   spironolactone  25 mg Oral Daily   cyanocobalamin  100 mcg Oral Daily   Continuous Infusions:   PRN Meds: acetaminophen **OR** acetaminophen, bisacodyl, ondansetron **OR** ondansetron (ZOFRAN) IV, polyethylene glycol, sodium chloride  flush, traZODone   Vital Signs    Vitals:   03/08/24 1200 03/08/24 1514 03/08/24 2029 03/09/24 0429  BP:  (!) 122/57 (!) 118/57 (!) 118/58  Pulse:  60    Resp:  18 16 16   Temp:  98.1 F (36.7 C) 98 F (36.7 C) 98.2 F (36.8 C)  TempSrc:  Oral Oral Oral  SpO2:  95% 95% 95%  Weight: 85.6 kg   83.1 kg  Height:        Intake/Output Summary (Last 24 hours) at 03/09/2024 1212 Last data filed at 03/09/2024 1203 Gross per 24 hour  Intake 301.21 ml  Output 3800 ml  Net -3498.79 ml   Filed Weights   03/08/24 0449 03/08/24 1200 03/09/24 0429  Weight: 88.5 kg 85.6 kg 83.1 kg    Telemetry     Personally reviewed.  A-paced rhythm  ECG    Not performed today.  Physical Exam   GEN: No acute distress.   Neck: JVD+. Cardiac: RRR, no murmur, rub, or gallop.  Respiratory: Nonlabored.  No rales GI: Soft, nontender, bowel sounds present. MS: 1+ pitting edema in bilateral extremities No deformity. Neuro:  Nonfocal. Psych: Alert and oriented x 3. Normal affect.  Labs    Chemistry Recent Labs  Lab 03/04/24 0954 03/05/24 0359 03/07/24 0332 03/07/24 1912  03/08/24 0404 03/08/24 1751 03/09/24 0335  NA 139   < > 137   < > 140 138 138  K 4.1   < > 4.2   < > 3.8 4.3 3.8  CL 104   < > 102   < > 101 97* 99  CO2 22   < > 26   < > 28 30 30   GLUCOSE 253*   < > 138*   < > 137* 156* 125*  BUN 70*   < > 79*   < > 75* 74* 73*  CREATININE 3.25*   < > 3.04*   < > 3.12* 3.10* 3.07*  CALCIUM 8.7*   < > 8.8*   < > 9.0 9.1 8.9  PROT 6.2*  --  5.8*  --   --   --   --   ALBUMIN 3.9  --  3.8  --   --   --   --   AST 23  --  17  --   --   --   --   ALT 27  --  23  --   --   --   --   ALKPHOS 175*  --  119  --   --   --   --  BILITOT 0.7  --  0.8  --   --   --   --   GFRNONAA 19*   < > 20*   < > 20* 20* 20*  ANIONGAP 12   < > 10   < > 11 11 10    < > = values in this interval not displayed.     Hematology Recent Labs  Lab 03/04/24 0954 03/07/24 0332 03/08/24 1751  WBC 7.3 6.3 7.0  RBC 3.19* 2.60* 2.84*  HGB 10.4* 8.5* 9.4*  HCT 34.1* 27.5* 29.8*  MCV 106.9* 105.8* 104.9*  MCH 32.6 32.7 33.1  MCHC 30.5 30.9 31.5  RDW 17.8* 17.1* 17.1*  PLT 130* 116* 131*    Cardiac EnzymesNo results for input(s): TROPONINIHS in the last 720 hours.  BNP Recent Labs  Lab 03/04/24 0954  PROBNP 19,477.0*     DDimerNo results for input(s): DDIMER in the last 168 hours.   Radiology    DG Chest 2 View Result Date: 03/09/2024 CLINICAL DATA:  Shortness of breath EXAM: CHEST - 2 VIEW COMPARISON:  03/04/2024 FINDINGS: Stable enlarged cardiac silhouette, post CABG changes and left subclavian pacer and AICD leads. Small to moderate-sized left pleural effusion, increased. Minimal left basilar atelectasis. Minimal increased density in the right mid lung zone appears to be due to overlapping bones with otherwise clear right lung. Moderate left glenohumeral degenerative changes. Thoracic spine degenerative changes. IMPRESSION: 1. Small to moderate-sized left pleural effusion, increased. 2. Minimal left basilar atelectasis. 3. Stable cardiomegaly. Electronically  Signed   By: Elspeth Bathe M.D.   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 20 lb weight gain. Compliant to diet and fluid. Had gout flares in the last couple of months treated with steroids. - JVD+, bibasilar rales cleared now, abd distention improved, pitting edema noted, improving. - Echo this admission showed LVEF 35-40%, G2DD, severe RV systolic dysfunction with severe RV enlargement, large pleural effusion, CVP 15 mm Hg. - 4.4 L urine output in the last 24 hours with net -4.1 L on Lasix drip 15 mg/h.  83 kg today. - 182 pounds/83 kg today.  Patient's wife reported that this is his dry weight and that he presented to the hospital with 202 pounds.  He is also reporting significant improvement in his SOB, abdominal distention and ambulation.  Stop Lasix drip.  Will administer 1 dose of IV Lasix 80 mg in the p.m.  Starting tomorrow, switch IV Lasix to p.o. torsemide 60 mg once daily (on 40 mg at home). - Coreg stopped initially due to ADHF.  Telemetry reviewed, had 1 episode of 5-7 beat NSVT.  Reviewed normal blood pressures off Coreg and HR 60 bpm, will start metoprolol tartrate 12.5 mg twice daily.  Severe anemia - Hemoglobin stabilized.  He had some epistaxis due to dry nose from [employer].  No active bleeding.   Large pleural effusion - Echo noted large pleural effusion on 03/05/24. CXR on admission noted small left pleural effusion.  Chest x-ray this a.m. showed moderate left pleural effusion.  Probably needs ultrasound-guided thoracentesis today or tomorrow.   CAD s/p 4v CABG in 2017 - No angina. Volume overloaded. Continue cardioprotective meds. No need of ASA due to Xarelto use. Cath in Jan 2025 revealed patent grafts and medical Mx  is recommended.   pAFib/flutter with sinus  pauses 5.1 seconds s/p dual chamber Medtronic ICD - Continue Xarelto 15 mg once daily.   Time spent in encounter: 40 minutes in reviewing prior notes from care Everywhere, >3 labs, discussion of the above with the patient, wife, daughter at bedside, primary team and documentation.  Signed, Diannah SHAUNNA Maywood, MD  03/09/2024, 12:12 PM

## 2024-03-09 NOTE — Progress Notes (Signed)
 PROGRESS NOTE    Tom Bartlett  FMW:[id number] DOB: 06/28/1945 DOA: 03/04/2024 PCP: Gerome Tillman CROME, FNP    Brief Narrative:  78 year old with history of CKD stage IV, baseline creatinine 2, chronic combined heart failure, pulmonary hypertension, 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  Compliant with home medications.  Admitted with cardiology consultation.  Subjective: No fever, no chest pain, no nausea, no vomiting.  Reports overall breathing improving/getting better.  At rest no requiring oxygen supplementation.  Still with some signs of fluid overload.  Assessment & Plan: Acute on chronic combined congestive heart failure: -Acute hypoxemic respiratory failure secondary to CHF exacerbation, keep on -Oxygen to keep saturation more than 90%. -Ischemic cardiomyopathy status post CABG in 2017, paroxysmal A-fib sick sinus syndrome status post AICD. -Presented with 20 pound weight gain, orthopnea, dyspnea on exertion.  -Echocardiogram with EF 35 to 40%, grade 2 diastolic dysfunction, severely enlarged RV.   - Excellent response to Lasix drip - Following cardiology recommendations will transition patient off to IV Lasix 80 mg twice a day and continue to follow volume status. -Currently unable to tolerate any GDMT due to abnormal kidney functions and blood pressure.   -Beta-blockers on hold.   -Cardiology following; will follow recommendations. -AICD functioning well. -Rate controlled.  Coreg on hold. - Repeat chest x-ray in a.m. to follow pleural effusion and stability.  Pleural effusion -Most likely associated with CHF - Despite adequate diuresis x-ray continue demonstrating moderate to large effusion. - Thoracentesis has been requested  Paroxysmal atrial fibrillation -Therapeutic on Xarelto-adjusted to renal  function. -Blood pressure overall stable. - Continue holding Coreg  AKI on CKD stage IV: Baseline creatinine about 2.3-2.5.  - Continue stabilizing with diuresis.   - Continue to closely follow-up renal function trend - Continue IV diuresis; now changing to 80 mg twice a day..   - Patient advised to maintain adequate oral hydration.  Chronic anemia thrombocytopenia:  -Chronic and stable. -Continue to follow trend intermittently.    DVT prophylaxis: SCDs Start: 03/04/24 1237 Place TED hose Start: 03/04/24 1237 Rivaroxaban (XARELTO) tablet 15 mg   Code Status: Full code Family Communication: None at bedside Disposition Plan: Status is: Inpatient Remains inpatient appropriate because: IV diuresis   Consultants:  Cardiology  Procedures:  None  Antimicrobials:  None  Objective: Vitals:   03/08/24 1514 03/08/24 2029 03/09/24 0429 03/09/24 1257  BP: (!) 122/57 (!) 118/57 (!) 118/58 (!) 112/59  Pulse: 60   62  Resp: 18 16 16 20   Temp: 98.1 F (36.7 C) 98 F (36.7 C) 98.2 F (36.8 C) (!) 97.5 F (36.4 C)  TempSrc: Oral Oral Oral Oral  SpO2: 95% 95% 95% 92%  Weight:   83.1 kg   Height:        Intake/Output Summary (Last 24 hours) at 03/09/2024 1637 Last data filed at 03/09/2024 1203 Gross per 24 hour  Intake 301.21 ml  Output 3100 ml  Net -2798.79 ml   Filed Weights   03/08/24 0449 03/08/24 1200 03/09/24 0429  Weight: 88.5 kg 85.6 kg 83.1 kg    Examination: General exam: Alert, awake, oriented x 3; reporting good urine output and breathing easier.  Currently no requiring oxygen supplementation. Respiratory system: Decreased breath sounds at the bases; no wheezing, no frank crackles on exam.  No using accessory muscles.  Good saturation on room air  on today's examination while at rest. Cardiovascular system: Rate controlled, no rubs, no gallops, no JVD on exam. Gastrointestinal system: Abdomen is nondistended, soft and nontender. No organomegaly or masses felt.  Normal bowel sounds heard. Central nervous system: No focal neurological deficits. Extremities: No cyanosis or clubbing; 2+ edema appreciated bilaterally. Skin: No petechiae. Psychiatry: Judgement and insight appear normal. Mood & affect appropriate.   Data Reviewed: I have personally reviewed following labs and imaging studies  CBC: Recent Labs  Lab 03/04/24 0954 03/07/24 0332 03/08/24 1751  WBC 7.3 6.3 7.0  NEUTROABS 5.8  --   --   HGB 10.4* 8.5* 9.4*  HCT 34.1* 27.5* 29.8*  MCV 106.9* 105.8* 104.9*  PLT 130* 116* 131*   Basic Metabolic Panel: Recent Labs  Lab 03/07/24 0332 03/07/24 1912 03/08/24 0404 03/08/24 1751 03/09/24 0335  NA 137 139 140 138 138  K 4.2 4.3 3.8 4.3 3.8  CL 102 101 101 97* 99  CO2 26 26 28 30 30   GLUCOSE 138* 189* 137* 156* 125*  BUN 79* 76* 75* 74* 73*  CREATININE 3.04* 3.24* 3.12* 3.10* 3.07*  CALCIUM 8.8* 9.0 9.0 9.1 8.9  MG 2.5*  --   --   --   --    GFR: Estimated Creatinine Clearance: 20.5 mL/min (A) (by C-G formula based on SCr of 3.07 mg/dL (H)).  Liver Function Tests: Recent Labs  Lab 03/04/24 0954 03/07/24 0332  AST 23 17  ALT 27 23  ALKPHOS 175* 119  BILITOT 0.7 0.8  PROT 6.2* 5.8*  ALBUMIN 3.9 3.8   BNP (last 3 results) Recent Labs    03/04/24 0954  PROBNP 19,477.0*   HbA1C: No results for input(s): HGBA1C in the last 72 hours.  CBG: Recent Labs  Lab 03/08/24 1200 03/08/24 1650 03/08/24 2207 03/09/24 0733 03/09/24 1152  GLUCAP 226* 125* 153* 123* 247*   Anemia Panel: Recent Labs    03/08/24 1053  FERRITIN 125  TIBC 316  IRON 36*    Radiology Studies: DG Chest 2 View Result Date: 03/09/2024 CLINICAL DATA:  Shortness of breath EXAM: CHEST - 2 VIEW COMPARISON:  03/04/2024 FINDINGS: Stable enlarged cardiac silhouette, post CABG changes and left subclavian pacer and AICD leads. Small to moderate-sized left pleural effusion, increased. Minimal left basilar atelectasis. Minimal increased density in the  right mid lung zone appears to be due to overlapping bones with otherwise clear right lung. Moderate left glenohumeral degenerative changes. Thoracic spine degenerative changes. IMPRESSION: 1. Small to moderate-sized left pleural effusion, increased. 2. Minimal left basilar atelectasis. 3. Stable cardiomegaly. Electronically Signed   By: Elspeth Bathe M.D.   On: 03/09/2024 11:19    Scheduled Meds:  furosemide  80 mg Intravenous BID   insulin aspart  0-5 Units Subcutaneous QHS   insulin aspart  0-6 Units Subcutaneous TID WC   Rivaroxaban  15 mg Oral Daily   senna-docusate  2 tablet Oral QHS   sodium bicarbonate  650 mg Oral Daily   sodium chloride  flush  3 mL Intravenous Q12H   sodium chloride  flush  3 mL Intravenous Q12H   spironolactone  25 mg Oral Daily   cyanocobalamin  100 mcg Oral Daily   Continuous Infusions:     LOS: 4 days    Time spent: 50 minutes    Eric Nunnery, MD [facility]

## 2024-03-10 ENCOUNTER — Inpatient Hospital Stay (HOSPITAL_COMMUNITY)

## 2024-03-10 DIAGNOSIS — I5043 Acute on chronic combined systolic (congestive) and diastolic (congestive) heart failure: Secondary | ICD-10-CM | POA: Diagnosis not present

## 2024-03-10 DIAGNOSIS — Z7901 Long term (current) use of anticoagulants: Secondary | ICD-10-CM | POA: Diagnosis not present

## 2024-03-10 DIAGNOSIS — N184 Chronic kidney disease, stage 4 (severe): Secondary | ICD-10-CM | POA: Diagnosis not present

## 2024-03-10 DIAGNOSIS — I2510 Atherosclerotic heart disease of native coronary artery without angina pectoris: Secondary | ICD-10-CM | POA: Diagnosis not present

## 2024-03-10 DIAGNOSIS — I480 Paroxysmal atrial fibrillation: Secondary | ICD-10-CM | POA: Diagnosis not present

## 2024-03-10 DIAGNOSIS — I5082 Biventricular heart failure: Secondary | ICD-10-CM | POA: Diagnosis not present

## 2024-03-10 LAB — BASIC METABOLIC PANEL WITH GFR
Anion gap: 11 (ref 5–15)
BUN: 66 mg/dL — ABNORMAL HIGH (ref 8–23)
CO2: 30 mmol/L (ref 22–32)
Calcium: 8.8 mg/dL — ABNORMAL LOW (ref 8.9–10.3)
Chloride: 98 mmol/L (ref 98–111)
Creatinine, Ser: 2.92 mg/dL — ABNORMAL HIGH (ref 0.61–1.24)
GFR, Estimated: 21 mL/min — ABNORMAL LOW (ref >60)
Glucose, Bld: 96 mg/dL (ref 70–99)
Potassium: 3.7 mmol/L (ref 3.5–5.1)
Sodium: 139 mmol/L (ref 135–145)

## 2024-03-10 LAB — GLUCOSE, CAPILLARY: Glucose-Capillary: 119 mg/dL — ABNORMAL HIGH (ref 70–99)

## 2024-03-10 MED ORDER — SODIUM BICARBONATE 650 MG PO TABS: 650.0000 mg | ORAL_TABLET | Freq: Every day | ORAL | 1 refills | Status: AC

## 2024-03-10 MED ORDER — METOPROLOL TARTRATE 25 MG PO TABS: 12.5000 mg | ORAL_TABLET | Freq: Two times a day (BID) | ORAL | 1 refills | Status: AC

## 2024-03-10 MED ORDER — POLYETHYLENE GLYCOL 3350 17 G PO PACK: 17.0000 g | PACK | Freq: Every day | ORAL | 0 refills | Status: AC | PRN

## 2024-03-10 MED ORDER — METOPROLOL TARTRATE 25 MG PO TABS
12.5000 mg | ORAL_TABLET | Freq: Two times a day (BID) | ORAL | Status: DC
  Filled 2024-03-10: qty 1

## 2024-03-10 MED ORDER — TORSEMIDE 60 MG PO TABS: 60.0000 mg | ORAL_TABLET | Freq: Every day | ORAL | 1 refills | Status: AC

## 2024-03-10 MED ORDER — SPIRONOLACTONE 25 MG PO TABS: 25.0000 mg | ORAL_TABLET | Freq: Every day | ORAL | 1 refills | Status: AC

## 2024-03-10 MED ORDER — TORSEMIDE 20 MG PO TABS: 60.0000 mg | ORAL_TABLET | Freq: Every day | ORAL | Status: DC

## 2024-03-10 NOTE — Progress Notes (Signed)
 AVS reviewed, discussed and explained to patient along with care giver. Both individuals acknowledged full understanding of the changes and all medications and appointments.

## 2024-03-10 NOTE — Care Management Important Message (Signed)
 Important Message  Patient Details  Name: Tom Bartlett MRN: [id number] Date of Birth: 11-Mar-1946   Important Message Given:  Yes - Medicare IM     [name] 03/10/2024, 11:15 AM

## 2024-03-10 NOTE — Progress Notes (Signed)
 Progress Note  Patient Name: Tom Bartlett Date of Encounter: 03/10/2024  Primary Cardiologist: None  Subjective   SOB, orthopnea, PND resolved.  Abdomen distention and leg swelling significant improved.  He wants to go home today.  Inpatient Medications    Scheduled Meds:  insulin aspart  0-5 Units Subcutaneous QHS   insulin aspart  0-6 Units Subcutaneous TID WC   Rivaroxaban  15 mg Oral Daily   senna-docusate  2 tablet Oral QHS   sodium bicarbonate  650 mg Oral Daily   sodium chloride  flush  3 mL Intravenous Q12H   sodium chloride  flush  3 mL Intravenous Q12H   spironolactone  25 mg Oral Daily   torsemide  60 mg Oral Daily   cyanocobalamin  100 mcg Oral Daily   Continuous Infusions:   PRN Meds: acetaminophen **OR** acetaminophen, bisacodyl, ondansetron **OR** ondansetron (ZOFRAN) IV, polyethylene glycol, sodium chloride  flush, traZODone   Vital Signs    Vitals:   03/10/24 0500 03/10/24 0535 03/10/24 0738 03/10/24 0842  BP:  114/65 (!) 105/52 (!) 108/48  Pulse:  61 60 70  Resp:  18 18 18   Temp:  97.7 F (36.5 C) 98.1 F (36.7 C)   TempSrc:  Oral Oral   SpO2:  94% 97% 97%  Weight: 80.1 kg     Height:        Intake/Output Summary (Last 24 hours) at 03/10/2024 1013 Last data filed at 03/10/2024 0532 Gross per 24 hour  Intake --  Output 3550 ml  Net -3550 ml   Filed Weights   03/08/24 1200 03/09/24 0429 03/10/24 0500  Weight: 85.6 kg 83.1 kg 80.1 kg    Telemetry     Personally reviewed.  A-paced rhythm  ECG    Not performed today.  Physical Exam   GEN: No acute distress.   Neck: JVD negative Cardiac: RRR, no murmur, rub, or gallop.  Respiratory: Nonlabored.  No rales GI: Soft, nontender, bowel sounds present. MS: Minimal pitting edema in bilateral extremities No deformity. Neuro:  Nonfocal. Psych: Alert and oriented x 3. Normal affect.  Labs    Chemistry Recent Labs  Lab 03/04/24 [phone] 03/05/24 0359 03/07/24 0332 03/07/24 1912  03/08/24 1751 03/09/24 0335 03/10/24 0331  NA 139   < > 137   < > 138 138 139  K 4.1   < > 4.2   < > 4.3 3.8 3.7  CL 104   < > 102   < > 97* 99 98  CO2 22   < > 26   < > 30 30 30   GLUCOSE 253*   < > 138*   < > 156* 125* 96  BUN 70*   < > 79*   < > 74* 73* 66*  CREATININE 3.25*   < > 3.04*   < > 3.10* 3.07* 2.92*  CALCIUM 8.7*   < > 8.8*   < > 9.1 8.9 8.8*  PROT 6.2*  --  5.8*  --   --   --   --   ALBUMIN 3.9  --  3.8  --   --   --   --   AST 23  --  17  --   --   --   --   ALT 27  --  23  --   --   --   --   ALKPHOS 175*  --  119  --   --   --   --  BILITOT 0.7  --  0.8  --   --   --   --   GFRNONAA 19*   < > 20*   < > 20* 20* 21*  ANIONGAP 12   < > 10   < > 11 10 11    < > = values in this interval not displayed.     Hematology Recent Labs  Lab 03/04/24 0954 03/07/24 0332 03/08/24 1751  WBC 7.3 6.3 7.0  RBC 3.19* 2.60* 2.84*  HGB 10.4* 8.5* 9.4*  HCT 34.1* 27.5* 29.8*  MCV 106.9* 105.8* 104.9*  MCH 32.6 32.7 33.1  MCHC 30.5 30.9 31.5  RDW 17.8* 17.1* 17.1*  PLT 130* 116* 131*    Cardiac EnzymesNo results for input(s): TROPONINIHS in the last 720 hours.  BNP Recent Labs  Lab 03/04/24 0954  PROBNP 19,477.0*     DDimerNo results for input(s): DDIMER in the last 168 hours.   Radiology    US  CHEST (PLEURAL EFFUSION) Result Date: 03/10/2024 CLINICAL DATA:  Patient with history of congestive heart failure admitted with volume overload in noted to have small left pleural effusion on chest x-ray. Request for possible left thoracentesis. EXAM: BILATERAL CHEST ULTRASOUND COMPARISON:  Chest x-ray 2 view 03/09/2024 FINDINGS: Limited ultrasound of the left chest shows very small pleural effusion, no safe pocket for thoracentesis. No pleural fluid on limited right chest ultrasound examination. IMPRESSION: Limited left chest ultrasound shows very small pleural effusion which is not amenable to thoracentesis. Electronically Signed   By: CHRISTELLA.  Shick M.D.   On: 03/10/2024 09:16    DG Chest 2 View Result Date: 03/09/2024 CLINICAL DATA:  Shortness of breath EXAM: CHEST - 2 VIEW COMPARISON:  03/04/2024 FINDINGS: Stable enlarged cardiac silhouette, post CABG changes and left subclavian pacer and AICD leads. Small to moderate-sized left pleural effusion, increased. Minimal left basilar atelectasis. Minimal increased density in the right mid lung zone appears to be due to overlapping bones with otherwise clear right lung. Moderate left glenohumeral degenerative changes. Thoracic spine degenerative changes. IMPRESSION: 1. Small to moderate-sized left pleural effusion, increased. 2. Minimal left basilar atelectasis. 3. Stable cardiomegaly. Electronically Signed   By: Elspeth Bathe M.D.   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 20 lb weight gain. Compliant to diet and fluid. Had gout flares in the last couple of months treated with steroids. - JVD+, bibasilar rales cleared now, abd distention improved, pitting edema noted, improving. - Echo this admission showed LVEF 35-40%, G2DD, severe RV systolic dysfunction with severe RV enlargement, large pleural effusion, CVP 15 mm Hg. - 4.4 L urine output in the last 24 hours with net -4.1 L on Lasix drip 15 mg/h.  83 kg today. - 176 pounds/80 kg today.  Patient's wife reported that his dry weight is usually around 183 pounds and that he presented to the hospital with 202 pounds.  Patient feeling much better.  No SOB, orthopnea, PND.  Leg swelling improved.  Abdomen distention improved.  Start p.o. torsemide 60 mg once daily (increased from home dose of 40 mg). - Coreg stopped initially due to ADHF.  He had NSVT on telemetry during this admission due to which metoprolol initiation was recommended.  Start metoprolol to tartrate 12.5 mg  twice daily.  Severe anemia - Hemoglobin  stabilized.  He had some epistaxis due to dry nose from [employer] oxygen.  No active bleeding.   Large pleural effusion - Echo noted large pleural effusion on 03/05/24. CXR on admission noted small left pleural effusion.  Chest x-ray this a.m. showed moderate left pleural effusion.  Ultrasound performed by IR showed only scant amount of pleural effusion.   CAD s/p 4v CABG in 2017 - No angina. Volume overloaded. Continue cardioprotective meds. No need of ASA due to Xarelto use. Cath in Jan 2025 revealed patent grafts and medical Mx is recommended.   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  [facility] will sign off.   Medication Recommendations: Continue current medications Other recommendations (labs, testing, etc): None Follow up as an outpatient: Already has heart failure appointment scheduled in the next 10 days    Time spent in encounter: 30 minutes in reviewing prior notes from care Everywhere, >3 labs, discussion of the above with the patient, wife, daughter at bedside, primary team and documentation.  Signed, Diannah SHAUNNA Maywood, MD  03/10/2024, 10:13 AM

## 2024-03-10 NOTE — Plan of Care (Deleted)
 ?  Problem: Clinical Measurements: ?Goal: Will remain free from infection ?Outcome: Progressing ?  ?

## 2024-03-10 NOTE — Discharge Summary (Signed)
 Physician Discharge Summary   Patient: Tom Bartlett MRN: [id number] DOB: December 05, 1945  Admit date:     03/04/2024  Discharge date: 03/10/24  Discharge Physician: Eric Nunnery   PCP: Gerome Tillman CROME, FNP   Recommendations at discharge:  Repeat basic metabolic panel to follow electrolytes and renal function Repeat CBC to follow hemoglobin trend/stability Reassess blood pressure and adjust antihypertensive med as needed Make sure patient follow-up with cardiology service as instructed Reassess CBG fluctuation/A1c with further adjustment to hypoglycemia regimen as required.   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  Compliant with home medications.  Admitted with cardiology consultation.  Assessment and Plan: Acute on chronic combined congestive heart failure: -Acute hypoxemic respiratory failure secondary to CHF exacerbation, keep on -Oxygen to keep saturation more than 90%. -Ischemic cardiomyopathy status post CABG in 2017, paroxysmal A-fib sick sinus  syndrome status post AICD. -Presented with 20 pound weight gain, orthopnea, dyspnea on exertion.  -Echocardiogram with EF 35 to 40%, grade 2 diastolic dysfunction, severely enlarged RV.   - Patient had excellent response to Lasix drip - Following cardiology recommendations patient will be discharged on oral Demadex 60 mg daily; spironolactone and adjusted dose of metoprolol. - Cardiology service will arrange outpatient follow-up to further determine adjustment into his GDMT. -AICD functioning well. -Rate controlled.  Continue adjusted dose of metoprolol as per cardiology recommendations.   Pleural effusion -Most likely associated with CHF - Continue fluid management with diuretics. - Thoracentesis was requested; patient's effusion improved/reabsorb and no thoracentesis was required.   Paroxysmal atrial fibrillation - Continue Xarelto - Continue adjusted dose of beta-blocker for rate control.   AKI on CKD stage IV/metabolic acidosis: Baseline creatinine about 2.3-2.5.  - Continue to maintain adequate oral hydration and follow low-sodium diet. - Continue to closely follow-up renal function trend - Continue diuretics as per cardiology service recommendations. - Patient encouraged to continue minimizing as much as possible over-the-counter nephrotoxic agents. - Resume home sodium bicarbonate daily.   Chronic anemia thrombocytopenia:  -Chronic and stable. -Continue to follow trend intermittently.  History of gout - Continue allopurinol. - No acute flare currently appreciated.    Consultants: Cardiology service. Procedures performed: See below for x-ray reports. Disposition: Home Diet recommendation: Heart healthy/modified carbohydrate diet.  DISCHARGE MEDICATION: Allergies as of 03/10/2024       Reactions   Statins Other (See Comments)   Muscle aches        Medication List     STOP taking these medications    amiodarone 200 MG tablet Commonly known as: PACERONE    ciprofloxacin 500 MG tablet Commonly known as: CIPRO   isosorbide mononitrate 30 MG 24 hr  tablet Commonly known as: IMDUR   metoprolol succinate 50 MG 24 hr tablet Commonly known as: TOPROL-XL       TAKE these medications    allopurinol 100 MG tablet Commonly known as: ZYLOPRIM Take 100 mg by mouth daily.   AMBULATORY NON FORMULARY MEDICATION Abdominal Binder - use as directed   Eye Health Caps Take 1 capsule by mouth in the morning and at bedtime.   Fiber 625 MG Tabs Take 1 tablet by mouth in the morning and at bedtime.   Fusion Plus Caps Take 1 capsule by mouth daily.   glipiZIDE 2.5 MG 24 hr tablet Commonly known as: GLUCOTROL XL Take 2.5 mg by mouth daily with breakfast.   metoprolol tartrate 25 MG tablet Commonly known as: LOPRESSOR Take 0.5 tablets (12.5 mg total) by mouth 2 (two) times daily.   polyethylene glycol 17 g packet Commonly known as: MIRALAX / GLYCOLAX Take 17 g by mouth daily as needed for mild constipation.   sodium bicarbonate 650 MG tablet Take 1 tablet (650 mg total) by mouth daily.   spironolactone 25 MG tablet Commonly known as: ALDACTONE Take 1 tablet (25 mg total) by mouth daily. Start taking on: March 11, 2024   Torsemide 60 MG Tabs Take 60 mg by mouth daily. What changed:  medication strength how much to take   Vitamin B12 1000 MCG Tabs Take 1,000 mcg by mouth daily.   Vitamin D3 50 MCG (2000 UT) capsule Take 2,000 Units by mouth daily.   Xarelto 15 MG Tabs tablet Generic drug: Rivaroxaban Take 15 mg by mouth daily with supper.               Discharge Care Instructions  (From admission, onward)           Start     Ordered   03/10/24 0000  Discharge wound care:       Comments: Keep area clean and dry; constant repositioning to off pressure recommended.   03/10/24 1149            Follow-up Information     [facility]. Go on 03/15/2024.   Specialty:  Cardiology Why: Hospital Follow-Up 03/15/24@ 9:00 Please bring all medications to follow-up appointment [facility], [address], Second Floor Free Valet Parking at the door Contact information: [address] [place]  [address] [phone]        Gerome Tillman CROME, FNP. Schedule an appointment as soon as possible for a visit in 10 day(s).   Specialty: Family Medicine Contact information: [address] [place] [address] [phone]                Discharge Exam: Fredricka Weights   03/08/24 1200 03/09/24 0429 03/10/24 0500  Weight: 85.6 kg 83.1 kg 80.1 kg   General exam: Alert, awake, oriented x 3; speaking in full sentences and denying orthopnea.  Feeling ready to go home. Respiratory system: Fair movement bilaterally; no using accessory muscles and demonstrating good saturation on room air.  No wheezing or crackles on exam. Cardiovascular system: Rate controlled, no rubs, no gallops, no JVD. Gastrointestinal system: Abdomen is nondistended, soft and nontender. No organomegaly or masses felt. Normal bowel sounds heard. Central nervous system:No focal neurological deficits. Extremities: No cyanosis or clubbing; 1+ edema appreciated bilaterally. Skin: No petechiae. Psychiatry: Judgement and insight appear normal. Mood & affect appropriate.    Condition at discharge: Stable and improved.  The results of significant diagnostics from this hospitalization (including imaging, microbiology, ancillary and laboratory) are listed below for reference.   Imaging Studies: US  CHEST (PLEURAL EFFUSION) Result Date: 03/10/2024 CLINICAL DATA:  Patient with history of congestive heart failure admitted with volume overload in noted to have small left pleural effusion on chest x-ray. Request for possible left thoracentesis. EXAM: BILATERAL CHEST ULTRASOUND COMPARISON:  Chest x-ray 2 view 03/09/2024 FINDINGS:  Limited ultrasound of the left chest shows very small pleural effusion, no safe pocket for thoracentesis. No pleural fluid on limited right chest ultrasound examination. IMPRESSION: Limited left chest ultrasound shows very small pleural effusion which is not amenable to thoracentesis. Electronically Signed   By: CHRISTELLA.  Shick M.D.   On: 03/10/2024 09:16   DG Chest 2 View Result Date: 03/09/2024 CLINICAL DATA:  Shortness of breath EXAM: CHEST - 2 VIEW COMPARISON:  03/04/2024 FINDINGS: Stable enlarged cardiac silhouette, post CABG changes and left subclavian pacer and AICD leads. Small to moderate-sized left pleural effusion, increased. Minimal left basilar atelectasis. Minimal increased density in the right mid lung zone appears to be due to overlapping bones with otherwise clear right lung. Moderate left glenohumeral degenerative changes. Thoracic spine degenerative changes. IMPRESSION: 1. Small to moderate-sized left pleural effusion, increased. 2. Minimal left basilar atelectasis. 3. Stable cardiomegaly. Electronically Signed   By: Elspeth Bathe M.D.   On: 03/09/2024 11:19   ECHOCARDIOGRAM COMPLETE Result Date: 03/05/2024    ECHOCARDIOGRAM REPORT   Patient Name:   Tom Bartlett Date of Exam: 03/05/2024 Medical Rec #:  [id number]    Height:       68.0 in Accession #:    [id number]   Weight:       200.4 lb Date of Birth:  10/28/1945    BSA:          2.045 m Patient Age:    78 years     BP:           110/63 mmHg Patient Gender: M            HR:           66 bpm. Exam Location:  [facility] Procedure: 2D Echo, Cardiac Doppler and Color Doppler (Both Spectral and Color            Flow Doppler were utilized during procedure). Indications:    I50.40* Unspecified combined systolic (congestive) and diastolic                 (congestive) heart failure  History:        Patient has no prior history of Echocardiogram examinations.                 CHF, CAD, Abnormal ECG and Defibrillator; Arrythmias:Atrial                  Fibrillation. Definity hypersensitivity. Prior echo done at                 another hospital per family.  Sonographer:    Ellouise Mose RDCS Referring Phys: [id number] [facility]  Sonographer Comments: Definity not done due to prevoius hypersensitivity reaction of back pain. IMPRESSIONS  1. Left ventricular ejection fraction, by estimation, is 35 to 40%. The left ventricle has moderately decreased function. The left ventricle demonstrates global hypokinesis. The left ventricular internal cavity size was mildly dilated. There is mild left ventricular hypertrophy. Left ventricular diastolic parameters are consistent with Grade II diastolic dysfunction (pseudonormalization). Elevated left atrial pressure.  There is the interventricular septum is flattened in systole and diastole, consistent with right ventricular pressure and volume overload.  2. Rv not well visualized. Grossly appears severely enlarged with severely decreased systolic function. . Right ventricular systolic function was not well visualized. The right ventricular size is not well visualized. There is mildly elevated pulmonary artery systolic pressure. The estimated right ventricular systolic pressure is 40.8 mmHg.  3. Left atrial size was mildly dilated.  4. Large pleural effusion.  5. The mitral valve is abnormal. Mild mitral valve regurgitation. No evidence of mitral stenosis.  6. The tricuspid valve is abnormal.  7. The aortic valve is tricuspid. There is mild calcification of the aortic valve. There is mild thickening of the aortic valve. Aortic valve regurgitation is not visualized. No aortic stenosis is present.  8. Aortic dilatation noted. There is mild dilatation of the aortic root, measuring 40 mm. There is mild dilatation of the ascending aorta, measuring 43 mm.  9. The inferior vena cava is dilated in size with <50% respiratory variability, suggesting right atrial pressure of 15 mmHg. FINDINGS  Left Ventricle: Left ventricular ejection fraction,  by estimation, is 35 to 40%. The left ventricle has moderately decreased function. The left ventricle demonstrates global hypokinesis. The left ventricular internal cavity size was mildly dilated. There is mild left ventricular hypertrophy. The interventricular septum is flattened in systole and diastole, consistent with right ventricular pressure and volume overload. Left ventricular diastolic parameters are consistent with Grade II diastolic dysfunction (pseudonormalization). Elevated left atrial pressure. Right Ventricle: Rv not well visualized. Grossly appears severely enlarged with severely decreased systolic function. The right ventricular size is not well visualized. Right vetricular wall thickness was not well visualized. Right ventricular systolic function was not well visualized. There is mildly elevated pulmonary artery systolic pressure. The tricuspid regurgitant velocity is 2.54 m/s, and with an assumed right atrial pressure of 15 mmHg, the estimated right ventricular systolic pressure is 40.8  mmHg. Left Atrium: Left atrial size was mildly dilated. Right Atrium: Right atrial size was normal in size. Pericardium: There is no evidence of pericardial effusion. Mitral Valve: The mitral valve is abnormal. Mild mitral valve regurgitation. No evidence of mitral valve stenosis. Tricuspid Valve: The tricuspid valve is abnormal. Tricuspid valve regurgitation is mild . No evidence of tricuspid stenosis. Aortic Valve: The aortic valve is tricuspid. There is mild calcification of the aortic valve. There is mild thickening of the aortic valve. There is mild aortic valve annular calcification. Aortic valve regurgitation is not visualized. No aortic stenosis  is present. Aortic valve mean gradient measures 3.3 mmHg. Aortic valve peak gradient measures 6.2 mmHg. Aortic valve area, by VTI measures 3.95 cm. Pulmonic Valve: The pulmonic valve was not well visualized. Pulmonic valve regurgitation is not visualized. No  evidence of pulmonic stenosis. Aorta: Aortic dilatation noted. There is mild dilatation of the aortic root, measuring 40 mm. There is mild dilatation of the ascending aorta, measuring 43 mm. Venous: The inferior vena cava is dilated in size with less than 50% respiratory variability, suggesting right atrial pressure of 15 mmHg. IAS/Shunts: No atrial level shunt detected by color flow Doppler. Additional Comments: A device lead is visualized in the right atrium and right ventricle. There is a large pleural effusion. Moderate ascites is present.  LEFT VENTRICLE PLAX 2D LVIDd:         6.20 cm      Diastology LVIDs:         5.20 cm  LV e' medial:    3.05 cm/s LV PW:         1.30 cm      LV E/e' medial:  37.7 LV IVS:        1.20 cm      LV e' lateral:   10.90 cm/s LVOT diam:     2.50 cm      LV E/e' lateral: 10.6 LV SV:         115 LV SV Index:   56 LVOT Area:     4.91 cm  LV Volumes (MOD) LV vol d, MOD A2C: 143.5 ml LV vol d, MOD A4C: 185.0 ml LV vol s, MOD A2C: 109.0 ml LV vol s, MOD A4C: 102.9 ml LV SV MOD A2C:     34.5 ml LV SV MOD A4C:     185.0 ml LV SV MOD BP:      56.7 ml RIGHT VENTRICLE            IVC RV S prime:     4.24 cm/s  IVC diam: 3.20 cm TAPSE (M-mode): 0.6 cm LEFT ATRIUM             Index        RIGHT ATRIUM           Index LA diam:        5.90 cm 2.88 cm/m   RA Area:     21.70 cm LA Vol (A2C):   77.1 ml 37.69 ml/m  RA Volume:   62.50 ml  30.56 ml/m LA Vol (A4C):   65.9 ml 32.22 ml/m LA Biplane Vol: 74.0 ml 36.18 ml/m  AORTIC VALVE AV Area (Vmax):    4.23 cm AV Area (Vmean):   3.97 cm AV Area (VTI):     3.95 cm AV Vmax:           124.23 cm/s AV Vmean:          84.223 cm/s AV VTI:            0.291 m AV Peak Grad:      6.2 mmHg AV Mean Grad:      3.3 mmHg LVOT Vmax:         107.00 cm/s LVOT Vmean:        68.100 cm/s LVOT VTI:          0.234 m LVOT/AV VTI ratio: 0.81  AORTA Ao Root diam: 4.00 cm Ao Asc diam:  4.35 cm MITRAL VALVE                TRICUSPID VALVE MV Area (PHT): 3.99 cm     TR  Peak grad:   25.8 mmHg MV Decel Time: 190 msec     TR Vmax:        254.00 cm/s MV E velocity: 115.00 cm/s MV A velocity: 65.50 cm/s   SHUNTS MV E/A ratio:  1.76         Systemic VTI:  0.23 m                             Systemic Diam: 2.50 cm Dorn Ross MD Electronically signed by Dorn Ross MD Signature Date/Time: 03/05/2024/12:34:00 PM    Final    DG Chest Port 1 View Result Date: 03/04/2024 CLINICAL DATA:  Shortness of breath and 17 pound weight gain over 1 week. EXAM: PORTABLE CHEST 1 VIEW COMPARISON:  06/22/2023 FINDINGS: Sternotomy wires unchanged. Left-sided pacemaker  unchanged. Lungs are adequately inflated with minimal blunting of the left costophrenic angle which is new. This likely represents a small amount left pleural fluid and associated atelectasis. Mild stable cardiomegaly. Remainder of the exam is unchanged. IMPRESSION: 1. New minimal blunting of the left costophrenic angle likely small amount of left pleural fluid and associated atelectasis. 2. Stable cardiomegaly. Electronically Signed   By: Toribio Agreste M.D.   On: 03/04/2024 10:19    Microbiology: No results found for this or any previous visit.  Labs: CBC: Recent Labs  Lab 03/04/24 0954 03/07/24 0332 03/08/24 1751  WBC 7.3 6.3 7.0  NEUTROABS 5.8  --   --   HGB 10.4* 8.5* 9.4*  HCT 34.1* 27.5* 29.8*  MCV 106.9* 105.8* 104.9*  PLT 130* 116* 131*   Basic Metabolic Panel: Recent Labs  Lab 03/07/24 0332 03/07/24 1912 03/08/24 0404 03/08/24 1751 03/09/24 0335 03/10/24 0331  NA 137 139 140 138 138 139  K 4.2 4.3 3.8 4.3 3.8 3.7  CL 102 101 101 97* 99 98  CO2 26 26 28 30 30 30   GLUCOSE 138* 189* 137* 156* 125* 96  BUN 79* 76* 75* 74* 73* 66*  CREATININE 3.04* 3.24* 3.12* 3.10* 3.07* 2.92*  CALCIUM 8.8* 9.0 9.0 9.1 8.9 8.8*  MG 2.5*  --   --   --   --   --    Liver Function Tests: Recent Labs  Lab 03/04/24 0954 03/07/24 0332  AST 23 17  ALT 27 23  ALKPHOS 175* 119  BILITOT 0.7 0.8  PROT 6.2*  5.8*  ALBUMIN 3.9 3.8   CBG: Recent Labs  Lab 03/09/24 0733 03/09/24 1152 03/09/24 1712 03/09/24 2148 03/10/24 0741  GLUCAP 123* 247* 258* 112* 119*    Discharge time spent:  35 minutes.  Signed: Eric Nunnery, MD [facility] 03/10/2024

## 2024-03-10 NOTE — Progress Notes (Cosign Needed Addendum)
 Pt sitting on the side of the bed 02 stats 96-97% consistently on room air. No longer requiring 2 L of oxygen.

## 2024-03-10 NOTE — Progress Notes (Signed)
 Limited US  of the left chest shows scant pleural fluid which is not amenable to safe thoracentesis. Right chest ultrasound without pleural fluid.  No procedure performed. Imaging results discussed with patient.  Images are available for review under imaging section of Epic.  Please call with questions or concerns.   Tom Bartlett

## 2024-03-14 ENCOUNTER — Telehealth: Payer: Self-pay | Admitting: Family

## 2024-03-14 NOTE — Progress Notes (Unsigned)
 [facility] Note   Referring Physician: PCP: Gerome Tillman CROME, FNP PCP-Cardiologist: None   Chief Complaint: Hospital Follow-Up   HPI:  [name] is a 78 yo male with a history of systoloc dysfunction of CHF, DVT/PE, CVA, MI, HTN, CKD stage 4, dyslipidemia, HLD, CAD, CABG, T2DM, afib, and prostate cancer.   LHC on 08/06/15 which showed LVEF of 60 %, 25% LM, 50/95% LAD, 50% in-stent LCX, 90% OM1, and 80% RCA.  CABGx 4 08/08/2015  Echo 01/15/22 EF 30% (Estimated), LV severe dysfunction with mild LVH, RV mild dysfunction, TR and MR.    Medtronic ICD placement 02/20/22   CT chest angiogram 02/03/23. ICD leads in correct position. Small bilateral pleural effusions and trace ascites. Dialted main PA. Thyroid nodule 10.7 noted, recommend thyroid US .   TTE 02/05/23 EF 30% (Estimated), LV function severely reduced, RV mild dysfunction, MR, mild TR, extensive calcification of aorta, and inferoseptal hypokineses and inferior akinesis.    TTE 06/15/23 EF 20-25%, LV severely dilated and function severely reduced. LA severely dilated, mild MR, mild to moderate TR, PVR, eevated RA pressure, and severe pulmonary hypertension. Cardiac catheter shows stable CAD.   Admitted 03/04/24-03/10/24 for acute exacerbation of CHF and acute hypoxic respiratory failure. Presented [facility] on exertion, orthopnea, edema, and 20 pound weight gain. O2 stats in the 80s requiring 2-3L of O2. CXR showed left sided pleural effusions and cardiomyopathy. Pro BNP of 19,477. Echo 03/05/24 EF 35-40%, LVH and moderately decreased function, LV has grade II diastolic dysfunction, elevated left atrial pressure and dilation,  RV not well visualized, severely decreased systolic function, large pleural effusions, and mild MR. Given IV lasix drip. Discharged home on Torsemide, Spironolactone, and Metoprolol (adjusted dose).   He presents today, accompanied by wife, for an initial TOC visit with a chief complaint of a hospital  follow up visit. Endorses no new or worsening symptoms since discharge. Denies shortness of breath, chest pain, dizziness, cough, edema, sleep difficulty. Has fatigue due to insomnia.  ICD in place. Reports ICD has not gone off since January. New lead last year Sept 2024.   Wife is managing medications. Currently taking torsemide, spirolactone, and metoprolol  with no associated complications. On renal diet for 2 years, NAS, not adding potassium and phosphorus foods. On 50 oz fluid restriction, couple glasses of water or tea. Mildly active around house with no dyspnea. Weighing daily, weight 175-176 lbs at home.   Wife concerned that amiodarone was discontinued at hospital discharge.   ROS: All systems negative except what is listed in HPI, PMH and Problem List  Past Medical History:  Diagnosis Date   AICD (automatic cardioverter/defibrillator) present    Anemia    CAD (coronary artery disease)    Chronic systolic heart failure (HCC)    Chronic venous insufficiency    CKD (chronic kidney disease)    Stage 3   COPD (chronic obstructive pulmonary disease) (HCC)    Diabetes (HCC)    DVT (deep venous thrombosis) (HCC) 1979   Left Leg   Dyslipidemia    Hypertension    Kidney stones    Myocardial infarction (HCC) 05/18/2005   Obstructive sleep apnea    PAF (paroxysmal atrial fibrillation) (HCC)    Peripheral artery disease    Prostate cancer (HCC)    Pulmonary embolism (HCC) 2003   Sick sinus syndrome (HCC)    Stroke (HCC)    2012, 2019  Some right arm numbness    Current Outpatient Medications  Medication Sig  Dispense Refill   allopurinol (ZYLOPRIM) 100 MG tablet Take 100 mg by mouth daily.     AMBULATORY NON FORMULARY MEDICATION Abdominal Binder - use as directed 1 Units 0   Calcium Polycarbophil (FIBER) 625 MG TABS Take 1 tablet by mouth in the morning and at bedtime.     Cholecalciferol (VITAMIN D3) 50 MCG (2000 UT) capsule Take 2,000 Units by mouth daily.     Cyanocobalamin  (VITAMIN B12) 1000 MCG TABS Take 1,000 mcg by mouth daily.     glipiZIDE (GLUCOTROL XL) 2.5 MG 24 hr tablet Take 2.5 mg by mouth daily with breakfast.     Iron-FA-B Cmp-C-Biot-Probiotic (FUSION PLUS) CAPS Take 1 capsule by mouth daily.     metoprolol tartrate (LOPRESSOR) 25 MG tablet Take 0.5 tablets (12.5 mg total) by mouth 2 (two) times daily. 60 tablet 1   Multiple Vitamins-Minerals (EYE HEALTH) CAPS Take 1 capsule by mouth in the morning and at bedtime.     polyethylene glycol (MIRALAX / GLYCOLAX) 17 g packet Take 17 g by mouth daily as needed for mild constipation. 14 each 0   sodium bicarbonate 650 MG tablet Take 1 tablet (650 mg total) by mouth daily. 30 tablet 1   spironolactone (ALDACTONE) 25 MG tablet Take 1 tablet (25 mg total) by mouth daily. 30 tablet 1   torsemide 60 MG TABS Take 60 mg by mouth daily. 30 tablet 1   XARELTO 15 MG TABS tablet Take 15 mg by mouth daily with supper.     No current facility-administered medications for this visit.    Allergies  Allergen Reactions   Statins Other (See Comments)    Muscle aches      Social History   Socioeconomic History   Marital status: Married    Spouse name: Not on file   Number of children: Not on file   Years of education: Not on file   Highest education level: Not on file  Occupational History   Not on file  Tobacco Use   Smoking status: Former    Types: Cigarettes   Smokeless tobacco: Never   Tobacco comments:    Quit over 40 yrs ago  Vaping Use   Vaping status: Never Used  Substance and Sexual Activity   Alcohol use: Not Currently    Comment: quit age 65   Drug use: Never   Sexual activity: Not on file  Other Topics Concern   Not on file  Social History Narrative   Not on file   Social Drivers of Health   Financial Resource Strain: Low Risk  (03/01/2023)   Received from [facility]   Overall Financial Resource Strain (CARDIA)    Difficulty of Paying Living Expenses: Not hard at  all  Food Insecurity: No Food Insecurity (03/04/2024)   Hunger Vital Sign    Worried About Running Out of Food in the Last Year: Never true    Ran Out of Food in the Last Year: Never true  Transportation Needs: No Transportation Needs (03/04/2024)   PRAPARE - Administrator, Civil Service (Medical): No    Lack of Transportation (Non-Medical): No  Physical Activity: Not on file  Stress: Not on file  Social Connections: Moderately Integrated (03/04/2024)   Social Connection and Isolation Panel    Frequency of Communication with Friends and Family: Three times a week    Frequency of Social Gatherings with Friends and Family: Three times a week    Attends Religious Services:  1 to 4 times per year    Active Member of Clubs or Organizations: No    Attends Banker Meetings: Never    Marital Status: Married  Catering Manager Violence: Not At Risk (03/04/2024)   Humiliation, Afraid, Rape, and Kick questionnaire    Fear of Current or Ex-Partner: No    Emotionally Abused: No    Physically Abused: No    Sexually Abused: No      Family History  Problem Relation Age of Onset   Dementia Mother    Diabetes Father    Heart disease Father    Congestive Heart Failure Father    Heart disease Sister    Heart disease Brother    Colon cancer Neg Hx    Stomach cancer Neg Hx    Pancreatic cancer Neg Hx    Esophageal cancer Neg Hx    Inflammatory bowel disease Neg Hx    Liver disease Neg Hx    Rectal cancer Neg Hx     Vitals:   03/15/24 0900  BP: (!) 117/56  Pulse: 62  SpO2: 100%  Weight: 80.3 kg   Wt Readings from Last 3 Encounters:  03/15/24 80.3 kg  03/10/24 80.1 kg  07/06/22 91.6 kg     PHYSICAL EXAM: General: Well appearing. No acute signs of distress.  HEENT: HOH  Cor: No JVD. Regular rhythm, rate. ICD in place.  Lungs: Clear bilaterally. Symmetrical chest expansion.  Abdomen: Soft, nontender, nondistended. Extremities: Trace pitting edema  bilateral LLE. No cyanosis or rashes.  Neuro:. AO x 3. Affect pleasant.    ECG: not done.   ICD Interrogation: 0 shocks , personally reviewed  ASSESSMENT & PLAN: 1. 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 - continue torsemide 60mg  daily  - continue spironolactone 25mg  daily  - continue metroprolol 12.5mg  BID  - hold entresto as wife states patient was told to discontinue due to hyperkalemia   - hold jardiance as wife reports previous discontinuance by nephrology, unsure of reasoning  - GFR will not allow for farxiga  - Pro BNP of 19,477 on 03/14/24 - BMET from 03/10/24 reviewed: sodium 139, potassium 3.7, creatinine 2.92, and GFR 21 - BMET and BNP today    2. Paroxysmal atrial fibrillation  - rate controlled  - continue xalreto 15mg  daily  - continue metroprolol 12.5mg  BID  - cardiology follow up encouraged for amiodrone discontinuance  3. CAD  - CABG x 4 2017 - LDL 70 on 10/24 - no s/s of angina   4. CKD (chronic kidney disease), stage IV  - saw nephrology ([place]) 07/25 - Baseline creatinine 2.3-2.5 - BMET today   5. HTN - BP 117/56 - stable    6. T2DM - A1c 6.8% on 03/05/24 - continue glipizide   7. Chronic anemia thrombocytopenia  - stable, continue iron supplement   - Hg 9.4 on 03/08/24   Follow up in 1 month,  sooner if needed.   Ellouise Class, FNP / Solomon Mariyana Fulop,  FNP-S 03/15/24

## 2024-03-14 NOTE — Telephone Encounter (Signed)
 Called to confirm/remind patient of their appointment at the [facility] Clinic on 03/15/24.   Appointment:   [x] Confirmed  [] Left mess   [] No answer/No voice mail  [] VM Full/unable to leave message  [] Phone not in service  Patient reminded to bring all medications and/or complete list.  Confirmed patient has transportation. Gave directions, instructed to utilize valet parking.

## 2024-03-15 ENCOUNTER — Ambulatory Visit: Attending: Family | Admitting: Family

## 2024-03-15 ENCOUNTER — Encounter: Payer: Self-pay | Admitting: Family

## 2024-03-15 ENCOUNTER — Ambulatory Visit: Admitting: Family

## 2024-03-15 VITALS — BP 117/56 | HR 62 | Wt 177.0 lb

## 2024-03-15 DIAGNOSIS — N184 Chronic kidney disease, stage 4 (severe): Secondary | ICD-10-CM | POA: Insufficient documentation

## 2024-03-15 DIAGNOSIS — Z87891 Personal history of nicotine dependence: Secondary | ICD-10-CM | POA: Insufficient documentation

## 2024-03-15 DIAGNOSIS — I083 Combined rheumatic disorders of mitral, aortic and tricuspid valves: Secondary | ICD-10-CM | POA: Insufficient documentation

## 2024-03-15 DIAGNOSIS — I480 Paroxysmal atrial fibrillation: Secondary | ICD-10-CM | POA: Diagnosis not present

## 2024-03-15 DIAGNOSIS — E875 Hyperkalemia: Secondary | ICD-10-CM | POA: Insufficient documentation

## 2024-03-15 DIAGNOSIS — I25810 Atherosclerosis of coronary artery bypass graft(s) without angina pectoris: Secondary | ICD-10-CM | POA: Diagnosis not present

## 2024-03-15 DIAGNOSIS — I130 Hypertensive heart and chronic kidney disease with heart failure and stage 1 through stage 4 chronic kidney disease, or unspecified chronic kidney disease: Secondary | ICD-10-CM | POA: Insufficient documentation

## 2024-03-15 DIAGNOSIS — E118 Type 2 diabetes mellitus with unspecified complications: Secondary | ICD-10-CM

## 2024-03-15 DIAGNOSIS — I252 Old myocardial infarction: Secondary | ICD-10-CM | POA: Diagnosis not present

## 2024-03-15 DIAGNOSIS — D631 Anemia in chronic kidney disease: Secondary | ICD-10-CM | POA: Insufficient documentation

## 2024-03-15 DIAGNOSIS — E1122 Type 2 diabetes mellitus with diabetic chronic kidney disease: Secondary | ICD-10-CM | POA: Diagnosis not present

## 2024-03-15 DIAGNOSIS — Z86718 Personal history of other venous thrombosis and embolism: Secondary | ICD-10-CM | POA: Insufficient documentation

## 2024-03-15 DIAGNOSIS — G4700 Insomnia, unspecified: Secondary | ICD-10-CM | POA: Diagnosis not present

## 2024-03-15 DIAGNOSIS — Z95810 Presence of automatic (implantable) cardiac defibrillator: Secondary | ICD-10-CM | POA: Insufficient documentation

## 2024-03-15 DIAGNOSIS — Z79899 Other long term (current) drug therapy: Secondary | ICD-10-CM | POA: Diagnosis not present

## 2024-03-15 DIAGNOSIS — R5383 Other fatigue: Secondary | ICD-10-CM | POA: Diagnosis not present

## 2024-03-15 DIAGNOSIS — J90 Pleural effusion, not elsewhere classified: Secondary | ICD-10-CM | POA: Insufficient documentation

## 2024-03-15 DIAGNOSIS — I10 Essential (primary) hypertension: Secondary | ICD-10-CM

## 2024-03-15 DIAGNOSIS — Z951 Presence of aortocoronary bypass graft: Secondary | ICD-10-CM | POA: Insufficient documentation

## 2024-03-15 DIAGNOSIS — D649 Anemia, unspecified: Secondary | ICD-10-CM

## 2024-03-15 DIAGNOSIS — I5022 Chronic systolic (congestive) heart failure: Secondary | ICD-10-CM | POA: Insufficient documentation

## 2024-03-15 DIAGNOSIS — Z8673 Personal history of transient ischemic attack (TIA), and cerebral infarction without residual deficits: Secondary | ICD-10-CM | POA: Insufficient documentation

## 2024-03-15 NOTE — Patient Instructions (Signed)
 Medication Changes:  No medication changes.   Lab Work:  Go downstairs to [facility] on [address] to have your blood work completed.  We will only call you if the results are abnormal or if the provider would like to make medication changes.  No news is good news.   Follow-Up in: 1 month with Ellouise Class, FNP.   Thank you for choosing [facility].    At the [facility] Clinic, you and your health needs are our priority. We have a designated team specialized in the treatment of Heart Failure. This Care Team includes your primary Heart Failure Specialized Cardiologist (physician), Advanced Practice Providers (APPs- Physician Assistants and Nurse Practitioners), and Pharmacist who all work together to provide you with the care you need, when you need it.   You may see any of the following providers on your designated Care Team at your next follow up:  Dr. Toribio Fuel Dr. Ezra Shuck Dr. Ria Commander Dr. Morene Brownie Ellouise Class, FNP Jaun Bash, RPH-CPP  Please be sure to bring in all your medications bottles to every appointment.   Need to Contact Us :  If you have any questions or concerns before your next appointment please send us  a message through [employer] or call our office at [phone].    TO LEAVE A MESSAGE FOR THE NURSE SELECT OPTION 2, PLEASE LEAVE A MESSAGE INCLUDING: YOUR NAME DATE OF BIRTH CALL BACK NUMBER REASON FOR CALL**this is important as we prioritize the call backs  YOU WILL RECEIVE A CALL BACK THE SAME DAY AS LONG AS YOU CALL BEFORE 4:00 PM

## 2024-03-16 LAB — BRAIN NATRIURETIC PEPTIDE: BNP: 1223 pg/mL — ABNORMAL HIGH (ref 0.0–100.0)

## 2024-03-16 LAB — CBC
Hematocrit: 31 % — ABNORMAL LOW (ref 37.5–51.0)
Hemoglobin: 9.9 g/dL — ABNORMAL LOW (ref 13.0–17.7)
MCH: 32.6 pg (ref 26.6–33.0)
MCHC: 31.9 g/dL (ref 31.5–35.7)
MCV: 102 fL — ABNORMAL HIGH (ref 79–97)
Platelets: 128 x10E3/uL — ABNORMAL LOW (ref 150–450)
RBC: 3.04 x10E6/uL — ABNORMAL LOW (ref 4.14–5.80)
RDW: 14.5 % (ref 11.6–15.4)
WBC: 7.5 x10E3/uL (ref 3.4–10.8)

## 2024-03-16 LAB — BASIC METABOLIC PANEL WITH GFR
BUN/Creatinine Ratio: 22 (ref 10–24)
BUN: 61 mg/dL — ABNORMAL HIGH (ref 8–27)
CO2: 24 mmol/L (ref 20–29)
Calcium: 9.1 mg/dL (ref 8.6–10.2)
Chloride: 101 mmol/L (ref 96–106)
Creatinine, Ser: 2.74 mg/dL — ABNORMAL HIGH (ref 0.76–1.27)
Glucose: 97 mg/dL (ref 70–99)
Potassium: 4.6 mmol/L (ref 3.5–5.2)
Sodium: 142 mmol/L (ref 134–144)
eGFR: 23 mL/min/1.73 — ABNORMAL LOW (ref >59)

## 2024-03-17 ENCOUNTER — Ambulatory Visit: Payer: Self-pay | Admitting: Family

## 2024-04-07 ENCOUNTER — Telehealth: Payer: Self-pay | Admitting: Family

## 2024-04-07 NOTE — Telephone Encounter (Signed)
 Called to confirm/remind patient of their appointment at the [facility] on 04/10/24.   Appointment:   [] Confirmed  [x] Left mess   [] No answer/No voice mail  [] VM Full/unable to leave message  [] Phone not in service  Patient reminded to bring all medications and/or complete list.  Confirmed patient has transportation. Gave directions, instructed to utilize valet parking.

## 2024-04-09 NOTE — Progress Notes (Deleted)
 Advanced [facility] Failure Clinic Note   Referring Physician: PCP: Gerome Tillman CROME, FNP PCP-Cardiologist: None   Chief Complaint:    HPI:  Tom Bartlett is a 78 yo male with a history of systoloc dysfunction of CHF, DVT/PE, CVA, MI, HTN, CKD stage 4, dyslipidemia, HLD, CAD, CABG, T2DM, afib, and prostate cancer.   LHC on 08/06/15 which showed LVEF of 60 %, 25% LM, 50/95% LAD, 50% in-stent LCX, 90% OM1, and 80% RCA.  CABGx 4 08/08/2015  Echo 01/15/22 EF 30% (Estimated), LV severe dysfunction with mild LVH, RV mild dysfunction, TR and MR.    Medtronic ICD placement 02/20/22   CT chest angiogram 02/03/23. ICD leads in correct position. Small bilateral pleural effusions and trace ascites. Dialted main PA. Thyroid nodule 10.7 noted, recommend thyroid US .   TTE 02/05/23 EF 30% (Estimated), LV function severely reduced, RV mild dysfunction, MR, mild TR, extensive calcification of aorta, and inferoseptal hypokineses and inferior akinesis.    TTE 06/15/23 EF 20-25%, LV severely dilated and function severely reduced. LA severely dilated, mild MR, mild to moderate TR, PVR, eevated RA pressure, and severe pulmonary hypertension. Cardiac catheter shows stable CAD.   Admitted 03/04/24-03/10/24 for acute exacerbation of CHF and acute hypoxic respiratory failure. Presented [facility] on exertion, orthopnea, edema, and 20 pound weight gain. O2 stats in the 80s requiring 2-3L of O2. CXR showed left sided pleural effusions and cardiomyopathy. Pro BNP of 19,477. Echo 03/05/24 EF 35-40%, LVH and moderately decreased function, LV has grade II diastolic dysfunction, elevated left atrial pressure and dilation,  RV not well visualized, severely decreased systolic function, large pleural effusions, and mild MR. Given IV lasix  drip. Discharged home on Torsemide , Spironolactone , and Metoprolol  (adjusted dose).   He presents today, accompanied by wife, for a HF follow-up visit with a chief complaint of   ICD in place. Reports ICD  has not gone off since January. New lead last year Sept 2024.    ROS: All systems negative except what is listed in HPI, PMH and Problem List  Past Medical History:  Diagnosis Date   AICD (automatic cardioverter/defibrillator) present    Anemia    CAD (coronary artery disease)    Chronic systolic heart failure (HCC)    Chronic venous insufficiency    CKD (chronic kidney disease)    Stage 3   COPD (chronic obstructive pulmonary disease) (HCC)    Diabetes (HCC)    DVT (deep venous thrombosis) (HCC) 1979   Left Leg   Dyslipidemia    Hypertension    Kidney stones    Myocardial infarction (HCC) 05/18/2005   Obstructive sleep apnea    PAF (paroxysmal atrial fibrillation) (HCC)    Peripheral artery disease    Prostate cancer (HCC)    Pulmonary embolism (HCC) 2003   Sick sinus syndrome (HCC)    Stroke (HCC)    2012, 2019  Some right arm numbness    Current Outpatient Medications  Medication Sig Dispense Refill   allopurinol (ZYLOPRIM) 100 MG tablet Take 100 mg by mouth daily.     AMBULATORY NON FORMULARY MEDICATION Abdominal Binder - use as directed 1 Units 0   Calcium Polycarbophil (FIBER) 625 MG TABS Take 1 tablet by mouth in the morning and at bedtime.     Cholecalciferol (VITAMIN D3) 50 MCG (2000 UT) capsule Take 2,000 Units by mouth daily.     Cyanocobalamin  (VITAMIN B12) 1000 MCG TABS Take 1,000 mcg by mouth daily.     glipiZIDE (GLUCOTROL XL) 2.5 MG  24 hr tablet Take 2.5 mg by mouth daily with breakfast.     Iron-FA-B Cmp-C-Biot-Probiotic (FUSION PLUS) CAPS Take 1 capsule by mouth daily.     metoprolol  tartrate (LOPRESSOR ) 25 MG tablet Take 0.5 tablets (12.5 mg total) by mouth 2 (two) times daily. 60 tablet 1   Multiple Vitamins-Minerals (EYE HEALTH) CAPS Take 1 capsule by mouth in the morning and at bedtime.     polyethylene glycol (MIRALAX  / GLYCOLAX ) 17 g packet Take 17 g by mouth daily as needed for mild constipation. (Patient not taking: Reported on 03/15/2024) 14 each  0   sodium bicarbonate  650 MG tablet Take 1 tablet (650 mg total) by mouth daily. 30 tablet 1   spironolactone  (ALDACTONE ) 25 MG tablet Take 1 tablet (25 mg total) by mouth daily. 30 tablet 1   torsemide  60 MG TABS Take 60 mg by mouth daily. (Patient taking differently: Take 60 mg by mouth daily. Taking 3 20 mg tablets until they run out.) 30 tablet 1   XARELTO  15 MG TABS tablet Take 15 mg by mouth daily with supper.     No current facility-administered medications for this visit.    Allergies  Allergen Reactions   Statins Other (See Comments)    Muscle aches      Social History   Socioeconomic History   Marital status: Married    Spouse name: Not on file   Number of children: Not on file   Years of education: Not on file   Highest education level: Not on file  Occupational History   Not on file  Tobacco Use   Smoking status: Former    Types: Cigarettes   Smokeless tobacco: Never   Tobacco comments:    Quit over 40 yrs ago  Vaping Use   Vaping status: Never Used  Substance and Sexual Activity   Alcohol use: Not Currently    Comment: quit age 43   Drug use: Never   Sexual activity: Not on file  Other Topics Concern   Not on file  Social History Narrative   Not on file   Social Drivers of Health   Financial Resource Strain: Low Risk  (03/01/2023)   Received from [facility]   Overall Financial Resource Strain (CARDIA)    Difficulty of Paying Living Expenses: Not hard at all  Food Insecurity: No Food Insecurity (03/04/2024)   Hunger Vital Sign    Worried About Running Out of Food in the Last Year: Never true    Ran Out of Food in the Last Year: Never true  Transportation Needs: No Transportation Needs (03/04/2024)   PRAPARE - Administrator, Civil Service (Medical): No    Lack of Transportation (Non-Medical): No  Physical Activity: Not on file  Stress: Not on file  Social Connections: Moderately Integrated (03/04/2024)   Social  Connection and Isolation Panel    Frequency of Communication with Friends and Family: Three times a week    Frequency of Social Gatherings with Friends and Family: Three times a week    Attends Religious Services: 1 to 4 times per year    Active Member of Clubs or Organizations: No    Attends Banker Meetings: Never    Marital Status: Married  Catering Manager Violence: Not At Risk (03/04/2024)   Humiliation, Afraid, Rape, and Kick questionnaire    Fear of Current or Ex-Partner: No    Emotionally Abused: No    Physically Abused: No  Sexually Abused: No      Family History  Problem Relation Age of Onset   Dementia Mother    Diabetes Father    Heart disease Father    Congestive Heart Failure Father    Heart disease Sister    Heart disease Brother    Colon cancer Neg Hx    Stomach cancer Neg Hx    Pancreatic cancer Neg Hx    Esophageal cancer Neg Hx    Inflammatory bowel disease Neg Hx    Liver disease Neg Hx    Rectal cancer Neg Hx         PHYSICAL EXAM: General: Well appearing. No acute signs of distress.  HEENT: HOH  Cor: No JVD. Regular rhythm, rate. ICD in place.  Lungs: Clear bilaterally. Symmetrical chest expansion.  Abdomen: Soft, nontender, nondistended. Extremities: Trace pitting edema bilateral LLE. No cyanosis or rashes.  Neuro:. AO x 3. Affect pleasant.    ECG: not done.   ICD Interrogation:   ASSESSMENT & PLAN: 1. 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 - continue torsemide  60mg  daily  - continue spironolactone  25mg  daily  - continue metroprolol 12.5mg  BID  - hold entresto as wife states patient was told to discontinue due to hyperkalemia   - hold jardiance as wife reports previous discontinuance by nephrology, unsure of reasoning  - GFR will not allow for farxiga  - Pro BNP of 19,477 on 03/14/24 - BMET 03/15/24 reviewed: sodium 142, potassium 4.6, creatinine 2.74, and GFR 23    2. Paroxysmal atrial fibrillation  - rate controlled  - continue xalreto 15mg  daily  - continue metroprolol 12.5mg  BID  - cardiology follow up encouraged for amiodrone discontinuance  3. CAD  - CABG x 4 2017 - LDL 70 on 10/24 - no s/s of angina   4. CKD (chronic kidney disease), stage IV  - saw nephrology ([place]) 11/25 - Baseline creatinine 2.3-2.5  5. HTN - BP  - stable    6. T2DM - A1c 6.8% on 03/05/24 - continue glipizide   7. Chronic anemia thrombocytopenia  - stable, continue iron supplement   - Hg 9.9 on 03/15/24     Ellouise Class, FNP / Solomon Blumenthal, FNP-S 04/09/24

## 2024-04-10 ENCOUNTER — Ambulatory Visit: Admitting: Family

## 2024-05-17 ENCOUNTER — Telehealth: Payer: Self-pay | Admitting: Family

## 2024-05-17 NOTE — Telephone Encounter (Signed)
 Called to r/s appt from 11/24

## 2024-06-07 ENCOUNTER — Ambulatory Visit: Admitting: Family
